# Patient Record
Sex: Female | Born: 2000 | Race: Black or African American | Hispanic: No | Marital: Single | Smoking: Never smoker
Health system: Southern US, Community
[De-identification: ages and names within clinical notes are randomized; demographics above are authoritative.]

## PROBLEM LIST (undated history)

## (undated) DIAGNOSIS — D563 Thalassemia minor: Secondary | ICD-10-CM

## (undated) DIAGNOSIS — L309 Dermatitis, unspecified: Secondary | ICD-10-CM

## (undated) DIAGNOSIS — E249 Cushing's syndrome, unspecified: Secondary | ICD-10-CM

## (undated) DIAGNOSIS — D649 Anemia, unspecified: Secondary | ICD-10-CM

## (undated) HISTORY — DX: Thalassemia minor: D56.3

## (undated) HISTORY — PX: STOMACH SURGERY: SHX791

---
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
## 2018-02-09 ENCOUNTER — Encounter (HOSPITAL_COMMUNITY): Payer: Self-pay | Admitting: Emergency Medicine

## 2018-02-09 ENCOUNTER — Other Ambulatory Visit: Payer: Self-pay

## 2018-02-09 ENCOUNTER — Ambulatory Visit (HOSPITAL_COMMUNITY)
Admission: EM | Admit: 2018-02-09 | Discharge: 2018-02-09 | Disposition: A | Discharge status: Home / Self Care | Payer: No Typology Code available for payment source | Attending: Family Medicine | Admitting: Family Medicine

## 2018-02-09 DIAGNOSIS — R2242 Localized swelling, mass and lump, left lower limb: Secondary | ICD-10-CM | POA: Diagnosis not present

## 2018-02-09 DIAGNOSIS — S80922A Unspecified superficial injury of left lower leg, initial encounter: Secondary | ICD-10-CM | POA: Insufficient documentation

## 2018-02-09 DIAGNOSIS — X58XXXA Exposure to other specified factors, initial encounter: Secondary | ICD-10-CM | POA: Diagnosis not present

## 2018-02-09 DIAGNOSIS — Z5189 Encounter for other specified aftercare: Secondary | ICD-10-CM

## 2018-02-09 MED ORDER — SULFAMETHOXAZOLE-TRIMETHOPRIM 800-160 MG PO TABS: 1.0000 | ORAL_TABLET | Freq: Two times a day (BID) | ORAL | 0 refills | Status: AC

## 2018-02-09 NOTE — Discharge Instructions (Signed)
Warm compresses Clean and apply dressing daily Take the antibiotic 2 x a day We did lab testing during this visit.  If there are any abnormal findings that require change in medicine or indicate a positive result, you will be notified.  If all of your tests are normal, you will not be called.

## 2018-02-09 NOTE — ED Triage Notes (Signed)
PT reports she has a scrape on her left leg that is not healing well. Has been there for a month.

## 2018-02-09 NOTE — ED Provider Notes (Addendum)
[facility]    CSN: [id number] Arrival date & time: 02/09/18  7371     History   Chief Complaint Chief Complaint  Patient presents with  . Wound Check    HPI Kimberly Hays is a 18 y.o. female.   HPI  Patient has a wound on her left shin that is been present for 3 to 4 weeks and not healing.  Swollen.  It is tender.  There is some yellow crusting.  She thinks it is infected.  She does not remember if she had trauma.  She does not think there is a foreign body.  She has not had any fever.  She states the area itches somewhat and is tender to touch.  Is in good health.  No recent travel.  Past Medical History:  Diagnosis Date  . Anemia     There are no active problems to display for this patient.   Past Surgical History:  Procedure Laterality Date  . STOMACH SURGERY     16 days old     OB History   No obstetric history on file.      Home Medications    Prior to Admission medications   Medication Sig Start Date End Date Taking? Authorizing Provider  loperamide (IMODIUM) 2 MG capsule Take 2 mg by mouth daily as needed for diarrhea or loose stools.    [provider]  sulfamethoxazole-trimethoprim (BACTRIM DS,SEPTRA DS) 800-160 MG tablet Take 1 tablet by mouth 2 (two) times daily for 7 days. 02/09/18 02/16/18  Kimberly Moore, MD    Family History No family history on file.  Social History Social History   Tobacco Use  . Smoking status: Never Smoker  . Smokeless tobacco: Never Used  Substance Use Topics  . Alcohol use: No    Frequency: Never  . Drug use: No     Allergies   Patient has no known allergies.   Review of Systems Review of Systems  Constitutional: Negative for chills and fever.  HENT: Negative for ear pain and sore throat.   Eyes: Negative for pain and visual disturbance.  Respiratory: Negative for cough and shortness of breath.   Cardiovascular: Negative for chest pain and palpitations.  Gastrointestinal: Negative  for abdominal pain and vomiting.  Genitourinary: Negative for dysuria and hematuria.  Musculoskeletal: Negative for arthralgias and back pain.  Skin: Positive for rash. Negative for color change.  Neurological: Negative for seizures and syncope.  All other systems reviewed and are negative.    Physical Exam Triage Vital Signs ED Triage Vitals  Enc Vitals Group     BP 02/09/18 0835 137/88     Pulse Rate 02/09/18 0835 99     Resp 02/09/18 0835 18     Temp 02/09/18 0835 97.9 F (36.6 C)     Temp Source 02/09/18 0835 Oral     SpO2 02/09/18 0835 99 %     Weight --      Height --      Head Circumference --      Peak Flow --      Pain Score 02/09/18 0836 0     Pain Loc --      Pain Edu? --      Excl. in GC? --    No data found.  Updated Vital Signs BP 137/88 (BP Location: Right Arm)   Pulse 99   Temp 97.9 F (36.6 C) (Oral)   Resp 18   LMP 01/27/2018   SpO2  99%    Physical Exam Constitutional:      General: She is not in acute distress.    Appearance: She is well-developed.  HENT:     Head: Normocephalic and atraumatic.  Eyes:     Conjunctiva/sclera: Conjunctivae normal.     Pupils: Pupils are equal, round, and reactive to light.  Neck:     Musculoskeletal: Normal range of motion.  Cardiovascular:     Rate and Rhythm: Normal rate.  Pulmonary:     Effort: Pulmonary effort is normal. No respiratory distress.  Abdominal:     General: There is no distension.     Palpations: Abdomen is soft.  Musculoskeletal: Normal range of motion.  Skin:    General: Skin is warm and dry.     Comments: Left leg has a 2 x 3 cm nodule with a superficial eschar on the top.  This is soaked off.  There is slight purulence underneath it.  No fluctuance to indicate abscess.  Cultures obtained  Neurological:     Mental Status: She is alert.      UC Treatments / Results  Labs (all labs ordered are listed, but only abnormal results are displayed) Labs Reviewed  AEROBIC CULTURE  (SUPERFICIAL SPECIMEN)    EKG None  Radiology No results found.  Procedures Procedures (including critical care time)  Medications Ordered in UC Medications - No data to display  Initial Impression / Assessment and Plan / UC Course  I have reviewed the triage vital signs and the nursing notes.  Pertinent labs & imaging results that were available during my care of the patient were reviewed by me and considered in my medical decision making (see chart for details).     Unusual not to have healing after 3 to 4 weeks.  We will do a culture, start antibiotics, and discussed more wound care. Final Clinical Impressions(s) / UC Diagnoses   Final diagnoses:  Visit for wound check     Discharge Instructions     Warm compresses Clean and apply dressing daily Take the antibiotic 2 x a day We did lab testing during this visit.  If there are any abnormal findings that require change in medicine or indicate a positive result, you will be notified.  If all of your tests are normal, you will not be called.      ED Prescriptions    Medication Sig Dispense Auth. Provider   sulfamethoxazole-trimethoprim (BACTRIM DS,SEPTRA DS) 800-160 MG tablet Take 1 tablet by mouth 2 (two) times daily for 7 days. 14 tablet Kimberly Moore, MD     Controlled Substance Prescriptions [place] Controlled Substance Registry consulted? Not Applicable   Kimberly Moore, MD 02/09/18 2119    Kimberly Moore, MD 02/09/18 2119

## 2018-02-11 LAB — AEROBIC CULTURE  (SUPERFICIAL SPECIMEN)

## 2018-02-11 LAB — AEROBIC CULTURE W GRAM STAIN (SUPERFICIAL SPECIMEN)
Culture: NORMAL
Gram Stain: NONE SEEN

## 2018-03-18 ENCOUNTER — Encounter (HOSPITAL_COMMUNITY): Payer: Self-pay | Admitting: *Deleted

## 2018-03-18 ENCOUNTER — Other Ambulatory Visit: Payer: Self-pay

## 2018-03-18 ENCOUNTER — Ambulatory Visit (HOSPITAL_COMMUNITY)
Admission: EM | Admit: 2018-03-18 | Discharge: 2018-03-18 | Disposition: A | Discharge status: Home / Self Care | Payer: BLUE CROSS/BLUE SHIELD | Attending: Internal Medicine | Admitting: Internal Medicine

## 2018-03-18 DIAGNOSIS — R05 Cough: Secondary | ICD-10-CM | POA: Diagnosis not present

## 2018-03-18 DIAGNOSIS — B9789 Other viral agents as the cause of diseases classified elsewhere: Secondary | ICD-10-CM | POA: Diagnosis not present

## 2018-03-18 DIAGNOSIS — J069 Acute upper respiratory infection, unspecified: Secondary | ICD-10-CM | POA: Diagnosis not present

## 2018-03-18 DIAGNOSIS — Z20828 Contact with and (suspected) exposure to other viral communicable diseases: Secondary | ICD-10-CM

## 2018-03-18 MED ORDER — BENZONATATE 100 MG PO CAPS: 200.0000 mg | ORAL_CAPSULE | Freq: Three times a day (TID) | ORAL | 0 refills | Status: DC | PRN

## 2018-03-18 NOTE — ED Triage Notes (Signed)
C/O productive cough, congestion, runny nose x approx 1 wk.  C/O intermittent HA.  Unsure if fevers.

## 2018-03-18 NOTE — ED Provider Notes (Signed)
[facility]    CSN: [id number] Arrival date & time: 03/18/18  1131     History   Chief Complaint Chief Complaint  Patient presents with  . Appointment    1200  . Cough    HPI Kimberly Hays is a 18 y.o. female.   18 year old female with no prior medical problems presents to urgent care complaining of cough this productive of thick white phlegm.  She may have had some fevers earlier this week but denies fever lately.  The cough hurts her lower back but she denies shortness of breath.  Reports that her brother has also been sick and was seen here at urgent care earlier this week for viral URI.     Past Medical History:  Diagnosis Date  . Anemia     There are no active problems to display for this patient.   Past Surgical History:  Procedure Laterality Date  . STOMACH SURGERY     67 days old     OB History   No obstetric history on file.      Home Medications    Prior to Admission medications   Medication Sig Start Date End Date Taking? Authorizing Provider  benzonatate (TESSALON) 100 MG capsule Take 2 capsules (200 mg total) by mouth 3 (three) times daily as needed for cough. 03/18/18   Arnaldo Natal, MD  loperamide (IMODIUM) 2 MG capsule Take 2 mg by mouth daily as needed for diarrhea or loose stools.    [provider]    Family History Family History  Problem Relation Age of Onset  . Diabetes Father     Social History Social History   Tobacco Use  . Smoking status: Never Smoker  . Smokeless tobacco: Never Used  Substance Use Topics  . Alcohol use: No    Frequency: Never  . Drug use: No     Allergies   Patient has no known allergies.   Review of Systems Review of Systems  Constitutional: Negative for chills and fever.  HENT: Negative for sore throat and tinnitus.   Eyes: Negative for redness.  Respiratory: Positive for cough. Negative for shortness of breath.   Cardiovascular: Negative for chest pain and  palpitations.  Gastrointestinal: Negative for abdominal pain, diarrhea, nausea and vomiting.  Genitourinary: Negative for dysuria, frequency and urgency.  Musculoskeletal: Negative for myalgias.  Skin: Negative for rash.       No lesions  Neurological: Negative for weakness.  Hematological: Does not bruise/bleed easily.  Psychiatric/Behavioral: Negative for suicidal ideas.     Physical Exam Triage Vital Signs ED Triage Vitals  Enc Vitals Group     BP 03/18/18 1158 (!) 127/98     Pulse Rate 03/18/18 1158 (!) 103     Resp 03/18/18 1158 16     Temp 03/18/18 1157 98.5 F (36.9 C)     Temp Source 03/18/18 1157 Oral     SpO2 03/18/18 1158 100 %     Weight --      Height --      Head Circumference --      Peak Flow --      Pain Score 03/18/18 1159 0     Pain Loc --      Pain Edu? --      Excl. in GC? --    No data found.  Updated Vital Signs BP (!) 127/98   Pulse (!) 103   Temp 98.5 F (36.9 C) (Oral)   Resp 16  LMP 03/04/2018 (Exact Date)   SpO2 100%   Visual Acuity Right Eye Distance:   Left Eye Distance:   Bilateral Distance:    Right Eye Near:   Left Eye Near:    Bilateral Near:     Physical Exam Vitals signs and nursing note reviewed.  Constitutional:      General: She is not in acute distress.    Appearance: She is well-developed.  HENT:     Head: Normocephalic and atraumatic.  Eyes:     General: No scleral icterus.    Conjunctiva/sclera: Conjunctivae normal.     Pupils: Pupils are equal, round, and reactive to light.  Neck:     Musculoskeletal: Normal range of motion and neck supple.     Thyroid: No thyromegaly.     Vascular: No JVD.     Trachea: No tracheal deviation.  Cardiovascular:     Rate and Rhythm: Normal rate and regular rhythm.     Heart sounds: Normal heart sounds. No murmur. No friction rub. No gallop.   Pulmonary:     Effort: Pulmonary effort is normal.     Breath sounds: Normal breath sounds.  Abdominal:     General: Bowel  sounds are normal. There is no distension.     Palpations: Abdomen is soft.     Tenderness: There is no abdominal tenderness.  Musculoskeletal: Normal range of motion.  Lymphadenopathy:     Cervical: No cervical adenopathy.  Skin:    General: Skin is warm and dry.  Neurological:     Mental Status: She is alert and oriented to person, place, and time.     Cranial Nerves: No cranial nerve deficit.  Psychiatric:        Behavior: Behavior normal.        Thought Content: Thought content normal.        Judgment: Judgment normal.      UC Treatments / Results  Labs (all labs ordered are listed, but only abnormal results are displayed) Labs Reviewed - No data to display  EKG None  Radiology No results found.  Procedures Procedures (including critical care time)  Medications Ordered in UC Medications - No data to display  Initial Impression / Assessment and Plan / UC Course  I have reviewed the triage vital signs and the nursing notes.  Pertinent labs & imaging results that were available during my care of the patient were reviewed by me and considered in my medical decision making (see chart for details).     Bronchitic cough.  Symptomatic relief  Final Clinical Impressions(s) / UC Diagnoses   Final diagnoses:  Viral URI with cough   Discharge Instructions   None    ED Prescriptions    Medication Sig Dispense Auth. Provider   benzonatate (TESSALON) 100 MG capsule Take 2 capsules (200 mg total) by mouth 3 (three) times daily as needed for cough. 21 capsule Arnaldo Natal, MD     Controlled Substance Prescriptions [place] Controlled Substance Registry consulted? Not Applicable   Arnaldo Natal, MD 03/18/18 1217

## 2018-03-27 ENCOUNTER — Emergency Department (HOSPITAL_COMMUNITY): Payer: BLUE CROSS/BLUE SHIELD

## 2018-03-27 ENCOUNTER — Emergency Department (HOSPITAL_COMMUNITY)
Admission: EM | Admit: 2018-03-27 | Discharge: 2018-03-27 | Disposition: A | Discharge status: Home / Self Care | Payer: BLUE CROSS/BLUE SHIELD | Attending: Emergency Medicine | Admitting: Emergency Medicine

## 2018-03-27 ENCOUNTER — Encounter (HOSPITAL_COMMUNITY): Payer: Self-pay | Admitting: *Deleted

## 2018-03-27 DIAGNOSIS — R0981 Nasal congestion: Secondary | ICD-10-CM | POA: Diagnosis not present

## 2018-03-27 DIAGNOSIS — J988 Other specified respiratory disorders: Secondary | ICD-10-CM | POA: Diagnosis not present

## 2018-03-27 DIAGNOSIS — R05 Cough: Secondary | ICD-10-CM | POA: Insufficient documentation

## 2018-03-27 DIAGNOSIS — M7918 Myalgia, other site: Secondary | ICD-10-CM | POA: Diagnosis present

## 2018-03-27 MED ORDER — DOXYCYCLINE HYCLATE 100 MG PO CAPS: 100.0000 mg | ORAL_CAPSULE | Freq: Two times a day (BID) | ORAL | 0 refills | Status: DC

## 2018-03-27 NOTE — ED Provider Notes (Addendum)
[facility] EMERGENCY DEPARTMENT Provider Note   CSN: [id number] Arrival date & time: 03/27/18  0802     History   Chief Complaint Chief Complaint  Patient presents with  . Generalized Body Aches    HPI Kimberly Hays is a 18 y.o. female.  HPI    18 year old female presents today with complaints of body aches.  Patient notes that for approximately 2 weeks she has had body aches rhinorrhea nasal congestion.  She notes she was seen approximately 10 days ago and diagnosed with viral upper respiratory infection.  She took cough medication with did not help.  She continues to have cough with yellow sputum.  She denies any chest pain or shortness of breath.  She denies any objective fevers.  She notes taking ibuprofen, no medications today.  She did not receive the influenza vaccine, she denies any chronic health conditions, no recent travel.  She notes her brother was sick with similar symptoms.     Past Medical History:  Diagnosis Date  . Anemia     There are no active problems to display for this patient.   Past Surgical History:  Procedure Laterality Date  . STOMACH SURGERY     27 days old      OB History   No obstetric history on file.     Home Medications    Prior to Admission medications   Medication Sig Start Date End Date Taking? Authorizing Provider  ibuprofen (ADVIL,MOTRIN) 200 MG tablet Take 400 mg by mouth every 6 (six) hours as needed for fever.   Yes [provider]  loperamide (IMODIUM) 2 MG capsule Take 2 mg by mouth daily as needed for diarrhea or loose stools.   Yes [provider]  benzonatate (TESSALON) 100 MG capsule Take 2 capsules (200 mg total) by mouth 3 (three) times daily as needed for cough. Patient not taking: Reported on 03/27/2018 03/18/18   Arnaldo Natal, MD  doxycycline (VIBRAMYCIN) 100 MG capsule Take 1 capsule (100 mg total) by mouth 2 (two) times daily. 03/27/18   Eyvonne Mechanic, PA-C     Family History Family History  Problem Relation Age of Onset  . Diabetes Father     Social History Social History   Tobacco Use  . Smoking status: Never Smoker  . Smokeless tobacco: Never Used  Substance Use Topics  . Alcohol use: No    Frequency: Never  . Drug use: No     Allergies   Patient has no known allergies.   Review of Systems Review of Systems  All other systems reviewed and are negative.   Physical Exam Updated Vital Signs BP 119/84 (BP Location: Right Arm)   Pulse (!) 111   Temp 98.3 F (36.8 C) (Oral)   Resp 18   LMP 03/04/2018 (Exact Date)   SpO2 100%   Physical Exam Vitals signs and nursing note reviewed.  Constitutional:      Appearance: She is well-developed.  HENT:     Head: Normocephalic and atraumatic.     Comments: Bilateral nasal mucosal edema with rhinorrhea, oropharynx clear with no erythema tonsillar swelling or exudate, neck supple with no lymphadenopathy Eyes:     General: No scleral icterus.       Right eye: No discharge.        Left eye: No discharge.     Conjunctiva/sclera: Conjunctivae normal.     Pupils: Pupils are equal, round, and reactive to light.  Neck:  Musculoskeletal: Normal range of motion.     Vascular: No JVD.     Trachea: No tracheal deviation.  Cardiovascular:     Rate and Rhythm: Regular rhythm.     Heart sounds: No murmur. No friction rub. No gallop.   Pulmonary:     Effort: Pulmonary effort is normal. No respiratory distress.     Breath sounds: No stridor. No wheezing, rhonchi or rales.  Neurological:     Mental Status: She is alert and oriented to person, place, and time.     Coordination: Coordination normal.  Psychiatric:        Behavior: Behavior normal.        Thought Content: Thought content normal.        Judgment: Judgment normal.      ED Treatments / Results  Labs (all labs ordered are listed, but only abnormal results are displayed) Labs Reviewed - No data to  display  EKG None  Radiology Dg Chest 2 View  Result Date: 03/27/2018 CLINICAL DATA:  Cough and congestion with body aches for 2 weeks EXAM: CHEST - 2 VIEW COMPARISON:  None FINDINGS: Normal heart size, mediastinal contours, and pulmonary vascularity. Lungs clear. No pleural effusion or pneumothorax. Triconvex thoracolumbar scoliosis. Suspected pectus deformity. IMPRESSION: No acute abnormalities. Electronically Signed   By: Ulyses Southward M.D.   On: 03/27/2018 10:36    Procedures Procedures (including critical care time)  Medications Ordered in ED Medications - No data to display   Initial Impression / Assessment and Plan / ED Course  I have reviewed the triage vital signs and the nursing notes.  Pertinent labs & imaging results that were available during my care of the patient were reviewed by me and considered in my medical decision making (see chart for details).     Labs:   Imaging:  Consults:  Therapeutics:  Discharge Meds: Doxycycline  Assessment/Plan: 18 year old female presents today with respiratory infection.  She has had several weeks of symptoms, I find it reasonable to start patient on the antibiotic given persistence of symptoms.  Patient will be encouraged to follow-up as an outpatient return immediately if she develops any new or worsening signs or symptoms.  She verbalized understanding and agreement to today's plan had no further questions or concerns.    Final Clinical Impressions(s) / ED Diagnoses   Final diagnoses:  Respiratory infection    ED Discharge Orders         Ordered    doxycycline (VIBRAMYCIN) 100 MG capsule  2 times daily     03/27/18 1041           Eyvonne Mechanic, PA-C 03/27/18 1043    Eyvonne Mechanic, PA-C 03/27/18 1044    Pricilla Loveless, MD 03/31/18 1520

## 2018-03-27 NOTE — Discharge Instructions (Addendum)
Please read attached information. If you experience any new or worsening signs or symptoms please return to the emergency room for evaluation. Please follow-up with your primary care provider or specialist as discussed. Please use medication prescribed only as directed and discontinue taking if you have any concerning signs or symptoms.   °

## 2018-03-27 NOTE — ED Triage Notes (Signed)
Pt in c/o cough and congestion with body aches for the last two weeks, was seen previously for same with no improvement

## 2019-09-07 ENCOUNTER — Other Ambulatory Visit: Payer: Self-pay | Admitting: Internal Medicine

## 2019-09-07 DIAGNOSIS — E249 Cushing's syndrome, unspecified: Secondary | ICD-10-CM

## 2019-09-07 DIAGNOSIS — L989 Disorder of the skin and subcutaneous tissue, unspecified: Secondary | ICD-10-CM

## 2019-09-20 ENCOUNTER — Other Ambulatory Visit: Payer: Medicaid Other

## 2019-09-30 ENCOUNTER — Ambulatory Visit
Admission: RE | Admit: 2019-09-30 | Discharge: 2019-09-30 | Disposition: A | Discharge status: Home / Self Care | Payer: 59 | Source: Ambulatory Visit | Attending: Physician Assistant | Admitting: Physician Assistant

## 2019-09-30 ENCOUNTER — Other Ambulatory Visit: Payer: Self-pay

## 2019-09-30 VITALS — BP 128/83 | HR 103 | Temp 98.3°F | Resp 16

## 2019-09-30 DIAGNOSIS — L309 Dermatitis, unspecified: Secondary | ICD-10-CM

## 2019-09-30 MED ORDER — TRIAMCINOLONE ACETONIDE 0.025 % EX OINT: 1.0000 "application " | TOPICAL_OINTMENT | Freq: Two times a day (BID) | CUTANEOUS | 0 refills | Status: DC

## 2019-09-30 NOTE — Discharge Instructions (Signed)
Trial triamcinolone ointment to affected area on hand. Wrap with plastic wrap to allow medicine to soak the fingers. Follow up with dermatology for further evaluation and management needed.

## 2019-09-30 NOTE — ED Triage Notes (Addendum)
Pt presents for evaluation of right ring finger irritation. She reports she noticed it one week ago.

## 2019-09-30 NOTE — ED Provider Notes (Signed)
[name]    CSN: [id number] Arrival date & time: 09/30/19  1302      History   Chief Complaint Chief Complaint  Patient presents with  . Finger Injury    HPI Kimberly Hays is a 19 y.o. female.   19 year old female comes in for possible infection to the right ring finger.  Patient with history of eczema, and has had recent flareup for the past week.  Now noticing some redness, irritation along nailbed of right ring finger.  Denies fever.  States was prescribed Eucrisa by dermatology, but has not picked this up yet.  Has used steroid creams in the past without significant relief.     Past Medical History:  Diagnosis Date  . Anemia     There are no problems to display for this patient.   Past Surgical History:  Procedure Laterality Date  . STOMACH SURGERY     86 days old     OB History   No obstetric history on file.      Home Medications    Prior to Admission medications   Medication Sig Start Date End Date Taking? Authorizing Provider  ibuprofen (ADVIL,MOTRIN) 200 MG tablet Take 400 mg by mouth every 6 (six) hours as needed for fever.    [provider]  loperamide (IMODIUM) 2 MG capsule Take 2 mg by mouth daily as needed for diarrhea or loose stools.    [provider]  triamcinolone (KENALOG) 0.025 % ointment Apply 1 application topically 2 (two) times daily. 09/30/19   Belinda Fisher, PA-C    Family History Family History  Problem Relation Age of Onset  . Diabetes Father     Social History Social History   Tobacco Use  . Smoking status: Never Smoker  . Smokeless tobacco: Never Used  Substance Use Topics  . Alcohol use: No  . Drug use: No     Allergies   Patient has no known allergies.   Review of Systems Review of Systems  Reason unable to perform ROS: See HPI as above.     Physical Exam Triage Vital Signs ED Triage Vitals  Enc Vitals Group     BP 09/30/19 1351 128/83     Pulse Rate 09/30/19 1351 (!)  103     Resp 09/30/19 1351 16     Temp 09/30/19 1351 98.3 F (36.8 C)     Temp Source 09/30/19 1351 Oral     SpO2 09/30/19 1351 99 %     Weight --      Height --      Head Circumference --      Peak Flow --      Pain Score 09/30/19 1402 6     Pain Loc --      Pain Edu? --      Excl. in GC? --    No data found.  Updated Vital Signs BP 128/83 (BP Location: Left Arm)   Pulse (!) 103   Temp 98.3 F (36.8 C) (Oral)   Resp 16   LMP 08/26/2019   SpO2 99%   Physical Exam Constitutional:      General: She is not in acute distress.    Appearance: Normal appearance. She is well-developed. She is not toxic-appearing or diaphoretic.  HENT:     Head: Normocephalic and atraumatic.  Eyes:     Conjunctiva/sclera: Conjunctivae normal.     Pupils: Pupils are equal, round, and reactive to light.  Pulmonary:  Effort: Pulmonary effort is normal. No respiratory distress.  Musculoskeletal:     Cervical back: Normal range of motion and neck supple.  Skin:    General: Skin is warm and dry.     Comments: Eczematous patches to the hands with hyperpigmentation and skin cracking/peeling. Right ring finger with wound around nailbed. No erythema, warmth, active drainage. No paronychia  Neurological:     Mental Status: She is alert and oriented to person, place, and time.      UC Treatments / Results  Labs (all labs ordered are listed, but only abnormal results are displayed) Labs Reviewed - No data to display  EKG   Radiology No results found.  Procedures Procedures (including critical care time)  Medications Ordered in UC Medications - No data to display  Initial Impression / Assessment and Plan / UC Course  I have reviewed the triage vital signs and the nursing notes.  Pertinent labs & imaging results that were available during my care of the patient were reviewed by me and considered in my medical decision making (see chart for details).    No signs of infection.  We will  trial a short course of triamcinolone ointment.  Otherwise patient to contact dermatology for further evaluation and management needed.  Return precautions given.  Final Clinical Impressions(s) / UC Diagnoses   Final diagnoses:  Eczema, unspecified type    ED Prescriptions    Medication Sig Dispense Auth. Provider   triamcinolone (KENALOG) 0.025 % ointment Apply 1 application topically 2 (two) times daily. 30 g Belinda Fisher, PA-C     PDMP not reviewed this encounter.   Belinda Fisher, PA-C 09/30/19 1655

## 2019-10-12 ENCOUNTER — Other Ambulatory Visit: Payer: Self-pay

## 2019-10-12 ENCOUNTER — Ambulatory Visit
Admission: RE | Admit: 2019-10-12 | Discharge: 2019-10-12 | Disposition: A | Discharge status: Home / Self Care | Payer: 59 | Source: Ambulatory Visit | Attending: Internal Medicine | Admitting: Internal Medicine

## 2019-10-12 DIAGNOSIS — E249 Cushing's syndrome, unspecified: Secondary | ICD-10-CM

## 2019-10-12 DIAGNOSIS — L989 Disorder of the skin and subcutaneous tissue, unspecified: Secondary | ICD-10-CM

## 2019-10-12 MED ORDER — GADOBENATE DIMEGLUMINE 529 MG/ML IV SOLN
15.0000 mL | Freq: Once | INTRAVENOUS | Status: AC | PRN
  Administered 2019-10-12: 15 mL via INTRAVENOUS

## 2020-05-27 ENCOUNTER — Ambulatory Visit (INDEPENDENT_AMBULATORY_CARE_PROVIDER_SITE_OTHER): Payer: 59 | Admitting: Podiatry

## 2020-05-27 ENCOUNTER — Other Ambulatory Visit: Payer: Self-pay

## 2020-05-27 DIAGNOSIS — L600 Ingrowing nail: Secondary | ICD-10-CM

## 2020-05-27 NOTE — Patient Instructions (Signed)

## 2020-05-28 ENCOUNTER — Telehealth: Payer: Self-pay | Admitting: *Deleted

## 2020-05-28 MED ORDER — NEOMYCIN-POLYMYXIN-HC 3.5-10000-1 OT SUSP: OTIC | 0 refills | Status: DC

## 2020-05-28 NOTE — Telephone Encounter (Signed)
Patient is calling for status of a medication that was supposed to be sent to pharmacy ([facility]). She went there yesterday and this morning and was not there. Please advise.

## 2020-05-28 NOTE — Telephone Encounter (Signed)
I've sent now. Thanks Ammie

## 2020-05-29 ENCOUNTER — Encounter: Payer: Self-pay | Admitting: Podiatry

## 2020-05-29 NOTE — Progress Notes (Signed)
  Subjective:  Patient ID: Kimberly Hays, female    DOB: February 13, 2000,  MRN: [id number]  Chief Complaint  Patient presents with  . Ingrown Toenail    Right hallux ingrown     20 y.o. female presents with the above complaint. History confirmed with patient.  Has pain without drainage  Objective:  Physical Exam: warm, good capillary refill, no trophic changes or ulcerative lesions, normal DP and PT pulses and normal sensory exam.  Left foot: Left hallux medial border ingrown without paronychia Assessment:   1. Ingrowing left great toenail      Plan:  Patient was evaluated and treated and all questions answered.    Ingrown Nail, left -Patient elects to proceed with minor surgery to remove ingrown toenail today. Consent reviewed and signed by patient. -Ingrown nail excised. See procedure note. -Educated on post-procedure care including soaking. Written instructions provided and reviewed. -Patient to follow up in 2 weeks for nail check.  Procedure: Excision of Ingrown Toenail Location: Left 1st toe medial nail borders. Anesthesia: Lidocaine 1% plain; 1.5 mL and Marcaine 0.5% plain; 1.5 mL, digital block. Skin Prep: Betadine. Dressing: Silvadene; telfa; dry, sterile, compression dressing. Technique: Following skin prep, the toe was exsanguinated and a tourniquet was secured at the base of the toe. The affected nail border was freed, split with a nail splitter, and excised. Chemical matrixectomy was then performed with phenol and irrigated out with alcohol. The tourniquet was then removed and sterile dressing applied. Disposition: Patient tolerated procedure well. Patient to return in 2 weeks for follow-up.     Return in about 2 weeks (around 06/10/2020) for nail re-check.

## 2020-05-29 NOTE — Telephone Encounter (Signed)
Called patient to informed thru Vmessage that prescription has been sent to pharmacy.

## 2020-06-10 ENCOUNTER — Ambulatory Visit (INDEPENDENT_AMBULATORY_CARE_PROVIDER_SITE_OTHER): Payer: 59 | Admitting: Podiatry

## 2020-06-10 ENCOUNTER — Encounter: Payer: Self-pay | Admitting: Podiatry

## 2020-06-10 ENCOUNTER — Other Ambulatory Visit: Payer: Self-pay

## 2020-06-10 DIAGNOSIS — L600 Ingrowing nail: Secondary | ICD-10-CM

## 2020-06-10 NOTE — Progress Notes (Signed)
  Subjective:  Patient ID: Kimberly Hays, female    DOB: 03/13/2000,  MRN: [id number]  Chief Complaint  Patient presents with  . Ingrown Toenail      2 week follow up nail check left    20 y.o. female returns with the above complaint. History confirmed with patient.  Doing much better Objective:  Physical Exam: warm, good capillary refill, no trophic changes or ulcerative lesions, normal DP and PT pulses and normal sensory exam.  Left foot: Matricectomy site is healing well Assessment:   1. Ingrowing left great toenail      Plan:  Patient was evaluated and treated and all questions answered.    Ingrown Nail, left -She is doing much better today.  I recommend she leave this open to air and discontinue soaks at this point.  Return as needed   Return if symptoms worsen or fail to improve.

## 2020-08-02 ENCOUNTER — Encounter (HOSPITAL_COMMUNITY): Payer: Self-pay | Admitting: Medical Oncology

## 2020-08-02 ENCOUNTER — Other Ambulatory Visit: Payer: Self-pay

## 2020-08-02 ENCOUNTER — Ambulatory Visit (HOSPITAL_COMMUNITY)
Admission: EM | Admit: 2020-08-02 | Discharge: 2020-08-02 | Disposition: A | Discharge status: Home / Self Care | Payer: 59 | Attending: Medical Oncology | Admitting: Medical Oncology

## 2020-08-02 DIAGNOSIS — R002 Palpitations: Secondary | ICD-10-CM | POA: Insufficient documentation

## 2020-08-02 HISTORY — DX: Cushing's syndrome, unspecified: E24.9

## 2020-08-02 LAB — BASIC METABOLIC PANEL
Anion gap: 8 (ref 5–15)
BUN: 13 mg/dL (ref 6–20)
CO2: 21 mmol/L — ABNORMAL LOW (ref 22–32)
Calcium: 9 mg/dL (ref 8.9–10.3)
Chloride: 108 mmol/L (ref 98–111)
Creatinine, Ser: 0.5 mg/dL (ref 0.44–1.00)
GFR, Estimated: >60 mL/min (ref >60)
Glucose, Bld: 83 mg/dL (ref 70–99)
Potassium: 4.1 mmol/L (ref 3.5–5.1)
Sodium: 137 mmol/L (ref 135–145)

## 2020-08-02 LAB — CBC
HCT: 31.7 % — ABNORMAL LOW (ref 36.0–46.0)
Hemoglobin: 9.9 g/dL — ABNORMAL LOW (ref 12.0–15.0)
MCH: 19.7 pg — ABNORMAL LOW (ref 26.0–34.0)
MCHC: 31.2 g/dL (ref 30.0–36.0)
MCV: 63.1 fL — ABNORMAL LOW (ref 80.0–100.0)
Platelets: 225 10*3/uL (ref 150–400)
RBC: 5.02 MIL/uL (ref 3.87–5.11)
RDW: 14.9 % (ref 11.5–15.5)
WBC: 3.3 10*3/uL — ABNORMAL LOW (ref 4.0–10.5)
nRBC: 0 % (ref 0.0–0.2)

## 2020-08-02 LAB — TSH: TSH: 1.431 u[IU]/mL (ref 0.350–4.500)

## 2020-08-02 NOTE — ED Provider Notes (Signed)
[facility]    CSN: [id number] Arrival date & time: 08/02/20  1002      History   Chief Complaint Chief Complaint  Patient presents with  . Tachycardia    HPI Kimberly Hays is a 20 y.o. female.   HPI  Tachycardia: Patient reports that for years she has had episodes of tachycardia with physical activity.  She states that yesterday she was out in the heat and helping her boss move some furniture when she felt her normal palpitations.  She states that normally the palpitations resolved with rest however these lasted a bit longer than normal.  The episodes do not come with syncope, dizziness, chest pain or shortness of breath.  She states she has a history of anemia and Cushing syndrome.  She states that she takes iron when she remembers.  Caffeine intake is low.  She reports that she has never seen a cardiologist. Past Medical History:  Diagnosis Date  . Anemia   . Cushing syndrome (HCC)    per pt report had cushing syndrome a year ago, reports now it has cleared up    There are no problems to display for this patient.   Past Surgical History:  Procedure Laterality Date  . STOMACH SURGERY     72 days old     OB History   No obstetric history on file.      Home Medications    Prior to Admission medications   Medication Sig Start Date End Date Taking? Authorizing Provider  Dupilumab (DUPIXENT [place]) Inject into the skin.   Yes [provider]  ibuprofen (ADVIL,MOTRIN) 200 MG tablet Take 400 mg by mouth every 6 (six) hours as needed for fever.   Yes [provider]  loperamide (IMODIUM) 2 MG capsule Take 2 mg by mouth daily as needed for diarrhea or loose stools.   Yes [provider]  UNABLE TO FIND Med Name: eucrisa cream for eczema per pt   Yes [provider]    Family History Family History  Problem Relation Age of Onset  . Anemia Mother   . Diabetes Father     Social History Social History   Tobacco Use  .  Smoking status: Never  . Smokeless tobacco: Never  Substance Use Topics  . Alcohol use: No  . Drug use: No     Allergies   Patient has no known allergies.   Review of Systems Review of Systems  As stated above in HPI Physical Exam Triage Vital Signs ED Triage Vitals  Enc Vitals Group     BP 08/02/20 1027 111/76     Pulse Rate 08/02/20 1027 91     Resp 08/02/20 1027 18     Temp 08/02/20 1027 98.1 F (36.7 C)     Temp Source 08/02/20 1027 Oral     SpO2 08/02/20 1027 98 %     Weight --      Height --      Head Circumference --      Peak Flow --      Pain Score 08/02/20 1014 0     Pain Loc --      Pain Edu? --      Excl. in GC? --    No data found.  Updated Vital Signs BP 111/76   Pulse 91   Temp 98.1 F (36.7 C) (Oral)   Resp 18   LMP 07/22/2020   SpO2 98%   Physical Exam Vitals and nursing  note reviewed.  Constitutional:      General: She is not in acute distress.    Appearance: Normal appearance. She is not ill-appearing, toxic-appearing or diaphoretic.  HENT:     Head: Normocephalic and atraumatic.     Mouth/Throat:     Mouth: Mucous membranes are moist.  Eyes:     Extraocular Movements: Extraocular movements intact.     Pupils: Pupils are equal, round, and reactive to light.     Comments: No significant pallor  Neck:     Comments: No carotid bruits auscultated bilaterally  Cardiovascular:     Rate and Rhythm: Normal rate and regular rhythm.     Heart sounds: Normal heart sounds.  Pulmonary:     Effort: Pulmonary effort is normal.     Breath sounds: Normal breath sounds.  Musculoskeletal:     Cervical back: Normal range of motion and neck supple.  Skin:    General: Skin is warm.     Coloration: Skin is not pale.  Neurological:     Mental Status: She is alert and oriented to person, place, and time.  Psychiatric:        Mood and Affect: Mood normal.        Behavior: Behavior normal.        Thought Content: Thought content normal.         Judgment: Judgment normal.     UC Treatments / Results  Labs (all labs ordered are listed, but only abnormal results are displayed) Labs Reviewed  CBC  BASIC METABOLIC PANEL  TSH    EKG   Radiology No results found.  Procedures Procedures (including critical care time)  Medications Ordered in UC Medications - No data to display  Initial Impression / Assessment and Plan / UC Course  I have reviewed the triage vital signs and the nursing notes.  Pertinent labs & imaging results that were available during my care of the patient were reviewed by me and considered in my medical decision making (see chart for details).     New.  Checking labs given her history of anemia and Cushing's disease.  I have encouraged patient to stay hydrated with water, to avoid heat or physical activity when possible.  Discussed red flag signs and symptoms and discussed the importance of cardiology follow-up.  We did review her EKG as well.  Final Clinical Impressions(s) / UC Diagnoses   Final diagnoses:  Palpitations   Discharge Instructions   None    ED Prescriptions   None    PDMP not reviewed this encounter.   Rushie Chestnut, [place] 08/02/20 1044

## 2020-08-02 NOTE — ED Triage Notes (Signed)
Pt c/o heart palpitations for a couple years worsening when working hard, states feels heart beating fast, sensation it is beating into throat. Reports palpitations normally subside with rest, but reports yesterday she noted they continued as she rested.  Reports history of anemia.

## 2020-08-18 ENCOUNTER — Encounter: Payer: Self-pay | Admitting: Cardiology

## 2020-08-18 ENCOUNTER — Other Ambulatory Visit: Payer: Self-pay

## 2020-08-18 ENCOUNTER — Ambulatory Visit (INDEPENDENT_AMBULATORY_CARE_PROVIDER_SITE_OTHER): Payer: 59 | Admitting: Cardiology

## 2020-08-18 VITALS — BP 120/60 | HR 116 | Ht 67.0 in | Wt 139.0 lb

## 2020-08-18 DIAGNOSIS — D509 Iron deficiency anemia, unspecified: Secondary | ICD-10-CM

## 2020-08-18 DIAGNOSIS — R002 Palpitations: Secondary | ICD-10-CM | POA: Diagnosis not present

## 2020-08-18 NOTE — Progress Notes (Signed)
Referring-[facility] Clent Jacks PA) Reason for referral-palpitations  HPI: 20 year old female for evaluation of palpitations at request of [facility].  Seen with complaints of tachycardia June 2022.  Laboratories show TSH 1.431, potassium 4.1 and hemoglobin 9.9 with MCV 63.1.  Patient states that recently she had noticed an elevation in her heart rate particularly with activities at work.  She would feel this in her throat and described as her heart racing.  She otherwise denies dyspnea on exertion, orthopnea, PND, pedal edema, exertional chest pain or syncope.  Since she was noted to be anemic which is longstanding and resumed her iron her symptoms have improved though she continues to have occasional feelings particularly in her throat.  Cardiology now asked to evaluate.  Current Outpatient Medications  Medication Sig Dispense Refill   Crisaborole 2 % OINT Apply topically 2 (two) times daily.     Dupilumab (DUPIXENT ) Inject into the skin.     ibuprofen (ADVIL,MOTRIN) 200 MG tablet Take 400 mg by mouth every 6 (six) hours as needed for fever. As Needed     loperamide (IMODIUM) 2 MG capsule Take 2 mg by mouth daily as needed for diarrhea or loose stools. As Needed     No current facility-administered medications for this visit.    No Known Allergies   Past Medical History:  Diagnosis Date   Anemia    Cushing syndrome (HCC)    per pt report had cushing syndrome a year ago, reports now it has cleared up    Past Surgical History:  Procedure Laterality Date   STOMACH SURGERY     74 days old     Social History   Socioeconomic History   Marital status: Single    Spouse name: Not on file   Number of children: Not on file   Years of education: Not on file   Highest education level: Not on file  Occupational History   Not on file  Tobacco Use   Smoking status: Never   Smokeless tobacco: Never  Substance and Sexual  Activity   Alcohol use: No   Drug use: No   Sexual activity: Not on file  Other Topics Concern   Not on file  Social History Narrative   Not on file   Social Determinants of Health   Financial Resource Strain: Not on file  Food Insecurity: Not on file  Transportation Needs: Not on file  Physical Activity: Not on file  Stress: Not on file  Social Connections: Not on file  Intimate Partner Violence: Not on file    Family History  Problem Relation Age of Onset   Anemia Mother    Diabetes Father     ROS: no fevers or chills, productive cough, hemoptysis, dysphasia, odynophagia, melena, hematochezia, dysuria, hematuria, rash, seizure activity, orthopnea, PND, pedal edema, claudication. Remaining systems are negative.  Physical Exam:   Blood pressure 120/60, pulse (!) 116, height 5\' 7"  (1.702 m), weight 139 lb (63 kg), last menstrual period 07/22/2020, SpO2 99 %.  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  Personally reviewed  A/P  1 Palpitations-symptoms sound likely to be sinus tachycardia related to her anemia.  However she continues to have occasional milder symptoms in her throat.  We will arrange an echocardiogram to assess LV function.  If she has worsening symptoms in the future we will consider placing a 30-day monitor.  I also discussed potentially purchasing an apple watch to monitor her rhythm.  2 microcytic anemia-follow-up primary care.  Olga Millers,  MD

## 2020-08-18 NOTE — Patient Instructions (Signed)
  Testing/Procedures:  Your physician has requested that you have an echocardiogram. Echocardiography is a painless test that uses sound waves to create images of your heart. It provides your doctor with information about the size and shape of your heart and how well your heart's chambers and valves are working. This procedure takes approximately one hour. There are no restrictions for this procedure. [address]   Follow-Up: At [facility], you and your health needs are our priority.  As part of our continuing mission to provide you with exceptional heart care, we have created designated Provider Care Teams.  These Care Teams include your primary Cardiologist (physician) and Advanced Practice Providers (APPs -  Physician Assistants and Nurse Practitioners) who all work together to provide you with the care you need, when you need it.  We recommend signing up for the patient portal called "MyChart".  Sign up information is provided on this After Visit Summary.  MyChart is used to connect with patients for Virtual Visits (Telemedicine).  Patients are able to view lab/test results, encounter notes, upcoming appointments, etc.  Non-urgent messages can be sent to your provider as well.   To learn more about what you can do with MyChart, go to [email or web address].    Your next appointment:    AS NEEDED 

## 2020-09-05 ENCOUNTER — Other Ambulatory Visit (HOSPITAL_COMMUNITY): Payer: 59

## 2020-09-09 ENCOUNTER — Ambulatory Visit (INDEPENDENT_AMBULATORY_CARE_PROVIDER_SITE_OTHER): Payer: 59

## 2020-09-09 ENCOUNTER — Encounter (HOSPITAL_COMMUNITY): Payer: Self-pay | Admitting: Emergency Medicine

## 2020-09-09 ENCOUNTER — Ambulatory Visit (HOSPITAL_COMMUNITY)
Admission: EM | Admit: 2020-09-09 | Discharge: 2020-09-09 | Disposition: A | Discharge status: Home / Self Care | Payer: 59 | Attending: Emergency Medicine | Admitting: Emergency Medicine

## 2020-09-09 ENCOUNTER — Other Ambulatory Visit: Payer: Self-pay

## 2020-09-09 DIAGNOSIS — M549 Dorsalgia, unspecified: Secondary | ICD-10-CM | POA: Diagnosis not present

## 2020-09-09 HISTORY — DX: Dermatitis, unspecified: L30.9

## 2020-09-09 MED ORDER — IBUPROFEN 600 MG PO TABS: 600.0000 mg | ORAL_TABLET | Freq: Four times a day (QID) | ORAL | 0 refills | Status: AC | PRN

## 2020-09-09 MED ORDER — TIZANIDINE HCL 4 MG PO TABS: 4.0000 mg | ORAL_TABLET | Freq: Three times a day (TID) | ORAL | 0 refills | Status: AC | PRN

## 2020-09-09 NOTE — ED Provider Notes (Signed)
HPI  SUBJECTIVE:  Kimberly Hays is a right-handed 20 y.o. female who presents with 4 to 5 days of intermittent, hours long, sharp, achy left upper back pain.  She states that she felt febrile and had a cough last week before the back pain started, but this has since resolved.  No chest pain, wheezing, shortness of breath.  No radiation of this pain down her arm, to the front of her chest.  She states that her left neck is also sore.  No Antipyretic since yesterday.  No Antibiotics in the past f74months.  She has never had symptoms like this before.  She tried ibuprofen 400 mg daily with improvement in her symptoms.  Symptoms worse with taking a deep breath and, and with a rowing motion.  No calf pain, swelling, hemoptysis, surgery in the past 4 weeks, recent immobilization, OCP use, history of PE, DVT, cancer.  She has a past medical history of Cushing syndrome and has a history of prolonged steroid use, anemia, palpitations.  No osteoporosis.  LMP: Last week.  Denies possibility being pregnant.  LKG:[facility]     Past Medical History:  Diagnosis Date   Anemia    Cushing syndrome (HCC)    per pt report had cushing syndrome a year ago, reports now it has cleared up   Eczema     Past Surgical History:  Procedure Laterality Date   STOMACH SURGERY     38 days old     Family History  Problem Relation Age of Onset   Anemia Mother    Diabetes Father     Social History   Tobacco Use   Smoking status: Never   Smokeless tobacco: Never  Vaping Use   Vaping Use: Never used  Substance Use Topics   Alcohol use: No   Drug use: No    No current facility-administered medications for this encounter.  Current Outpatient Medications:    ibuprofen (ADVIL) 600 MG tablet, Take 1 tablet (600 mg total) by mouth every 6 (six) hours as needed., Disp: 30 tablet, Rfl: 0   tiZANidine (ZANAFLEX) 4 MG tablet, Take 1 tablet (4 mg total) by mouth every 8 (eight) hours as  needed for muscle spasms., Disp: 30 tablet, Rfl: 0   Crisaborole 2 % OINT, Apply topically 2 (two) times daily., Disp: , Rfl:    Dupilumab (DUPIXENT [place]), Inject into the skin., Disp: , Rfl:    loperamide (IMODIUM) 2 MG capsule, Take 2 mg by mouth daily as needed for diarrhea or loose stools. As Needed, Disp: , Rfl:   No Known Allergies   ROS  As noted in HPI.   Physical Exam  BP 120/85 (BP Location: Right Arm)   Pulse 98   Temp 98.3 F (36.8 C) (Oral)   Resp 16   LMP 09/01/2020   SpO2 100%   Constitutional: Well developed, well nourished, no acute distress Eyes:  EOMI, conjunctiva normal bilaterally HENT: Normocephalic, atraumatic,mucus membranes moist Respiratory: Normal inspiratory effort, lungs clear bilaterally. Cardiovascular: Normal rate, regular rhythm no murmurs rubs or gallops GI: nondistended skin: No rash over posterior chest/shoulder, skin intact Back: Positive left trapezial tenderness, muscle spasm.  Positive tenderness over the scapula and left latissimus.  Musculoskeletal: Calves symmetric, nontender, no edema Neurologic: Alert & oriented x 3, no focal neuro deficits Psychiatric: Speech and behavior appropriate   ED Course   Medications - No data to display  Orders Placed This Encounter  Procedures   DG Chest  2 View    Standing Status:   Standing    Number of Occurrences:   1    Order Specific Question:   Reason for Exam (SYMPTOM  OR DIAGNOSIS REQUIRED)    Answer:   L upper back pain, r/o PNA, PTX    No results found for this or any previous visit (from the past 24 hour(s)). DG Chest 2 View  Result Date: 09/09/2020 CLINICAL DATA:  20 year old female with left upper back pain EXAM: CHEST - 2 VIEW COMPARISON:  Chest radiograph, 03/27/2018 FINDINGS: Cardiac silhouette is within normal limits given patient rotation. The lungs are well inflated. No focal consolidation or mass. No pleural effusion or pneumothorax. Thoracolumbar rotoscoliosis. No acute  osseous abnormality. IMPRESSION: No active cardiopulmonary disease. Electronically Signed   By: Roanna Banning MD   On: 09/09/2020 08:57    ED Clinical Impression  1. Upper back pain on left side      ED Assessment/Plan  Suspect musculoskeletal cause from all the coughing that she did last week, however will check an x-ray to rule out pneumothorax, pneumonia.  Doubt ACS.  Patient is PERC negative.  Doubt PE.  If x-ray is negative, will treat as a musculoskeletal cause with Tylenol/ibuprofen, Zanaflex.  Follow-up with PMD as needed.  ER return precautions given.  Reviewed imaging independently.  Normal chest x-ray.  see radiology report for full details.  Patient with normal chest x-ray.  Presentation consistent with musculoskeletal pain.  Plan as above  Discussed  imaging, MDM, treatment plan, and plan for follow-up with patient. Discussed sn/sx that should prompt return to the ED. patient agrees with plan.   Meds ordered this encounter  Medications   ibuprofen (ADVIL) 600 MG tablet    Sig: Take 1 tablet (600 mg total) by mouth every 6 (six) hours as needed.    Dispense:  30 tablet    Refill:  0   tiZANidine (ZANAFLEX) 4 MG tablet    Sig: Take 1 tablet (4 mg total) by mouth every 8 (eight) hours as needed for muscle spasms.    Dispense:  30 tablet    Refill:  0       *This clinic note was created using Scientist, clinical (histocompatibility and immunogenetics). Therefore, there may be occasional mistakes despite careful proofreading.  ?    Domenick Gong, MD 09/09/20 [phone]

## 2020-09-09 NOTE — Discharge Instructions (Addendum)
Take 600 mg of ibuprofen, 1000 mg of Tylenol together 3-4 times a day.  Zanaflex  for muscle spasms.  You can try deep tissue massage and Epson salt soaks.  Your x-ray was normal.  There is no evidence of an infection or collapsed lung.

## 2020-09-09 NOTE — ED Triage Notes (Signed)
Left upper back pain, hurts worse with deep breaths and movement of left arm from front to back of body causes pain.  Pain for one week.  No known injury

## 2020-09-16 ENCOUNTER — Ambulatory Visit (HOSPITAL_COMMUNITY): Payer: 59 | Attending: Internal Medicine

## 2020-09-16 ENCOUNTER — Other Ambulatory Visit: Payer: Self-pay

## 2020-09-16 DIAGNOSIS — R002 Palpitations: Secondary | ICD-10-CM | POA: Diagnosis present

## 2020-09-16 LAB — ECHOCARDIOGRAM COMPLETE
Area-P 1/2: 4.44 cm2
S' Lateral: 2.7 cm

## 2020-10-15 ENCOUNTER — Other Ambulatory Visit: Payer: Self-pay

## 2020-10-15 ENCOUNTER — Encounter (HOSPITAL_COMMUNITY): Payer: Self-pay | Admitting: Emergency Medicine

## 2020-10-15 ENCOUNTER — Ambulatory Visit (HOSPITAL_COMMUNITY)
Admission: EM | Admit: 2020-10-15 | Discharge: 2020-10-15 | Disposition: A | Discharge status: Home / Self Care | Payer: 59 | Attending: Family Medicine | Admitting: Family Medicine

## 2020-10-15 DIAGNOSIS — R0781 Pleurodynia: Secondary | ICD-10-CM

## 2020-10-15 LAB — POCT URINALYSIS DIPSTICK, ED / UC
Bilirubin Urine: NEGATIVE
Glucose, UA: NEGATIVE mg/dL
Hgb urine dipstick: NEGATIVE
Leukocytes,Ua: NEGATIVE
Nitrite: NEGATIVE
Protein, ur: NEGATIVE mg/dL
Specific Gravity, Urine: >=1.03 (ref 1.005–1.030)
Urobilinogen, UA: 2 mg/dL — ABNORMAL HIGH (ref 0.0–1.0)
pH: 6 (ref 5.0–8.0)

## 2020-10-15 LAB — POC URINE PREG, ED: Preg Test, Ur: NEGATIVE

## 2020-10-15 MED ORDER — NAPROXEN 500 MG PO TABS: 500.0000 mg | ORAL_TABLET | Freq: Two times a day (BID) | ORAL | 0 refills | Status: AC | PRN

## 2020-10-15 NOTE — ED Triage Notes (Signed)
Pt c/o left flank pain that started yesterday that is worse with taking deep breaths. Repots had back pains recently seen here for it and gotten a massage that helped. Denies urinary or bowel problems. Denies falls or injuries or lifting but has been going to the gym,.

## 2020-10-18 NOTE — ED Provider Notes (Signed)
[facility]    CSN: [id number] Arrival date & time: 10/15/20  1826      History   Chief Complaint Chief Complaint  Patient presents with   Flank Pain    HPI Kimberly Hays is a 20 y.o. female.   Patient presenting today with 1 day history of left lateral flank/rib pain that is worse with taking deep breaths or moving certain ways.  She states she works out almost every day but does not do any heavy lifting.  Denies injuring herself at any time recently.  She denies dysuria, hematuria, abdominal pain, nausea vomiting diarrhea or constipation.  No fevers, chills, body aches.  Not trying anything over-the-counter for symptoms.   Past Medical History:  Diagnosis Date   Anemia    Cushing syndrome (HCC)    per pt report had cushing syndrome a year ago, reports now it has cleared up   Eczema     There are no problems to display for this patient.   Past Surgical History:  Procedure Laterality Date   STOMACH SURGERY     82 days old     OB History   No obstetric history on file.      Home Medications    Prior to Admission medications   Medication Sig Start Date End Date Taking? Authorizing Provider  naproxen (NAPROSYN) 500 MG tablet Take 1 tablet (500 mg total) by mouth 2 (two) times daily as needed. 10/15/20  Yes Particia Nearing, PA-C  Crisaborole 2 % OINT Apply topically 2 (two) times daily.    [provider]  Dupilumab (DUPIXENT [place]) Inject into the skin.    [provider]  ibuprofen (ADVIL) 600 MG tablet Take 1 tablet (600 mg total) by mouth every 6 (six) hours as needed. 09/09/20   Domenick Gong, MD  loperamide (IMODIUM) 2 MG capsule Take 2 mg by mouth daily as needed for diarrhea or loose stools. As Needed    [provider]  tiZANidine (ZANAFLEX) 4 MG tablet Take 1 tablet (4 mg total) by mouth every 8 (eight) hours as needed for muscle spasms. 09/09/20   Domenick Gong, MD    Family History Family History  Problem  Relation Age of Onset   Anemia Mother    Diabetes Father     Social History Social History   Tobacco Use   Smoking status: Never   Smokeless tobacco: Never  Vaping Use   Vaping Use: Never used  Substance Use Topics   Alcohol use: No   Drug use: No     Allergies   Patient has no known allergies.   Review of Systems Review of Systems Per HPI  Physical Exam Triage Vital Signs ED Triage Vitals  Enc Vitals Group     BP 10/15/20 1851 93/61     Pulse Rate 10/15/20 1851 99     Resp 10/15/20 1851 17     Temp 10/15/20 1851 98.4 F (36.9 C)     Temp Source 10/15/20 1851 Oral     SpO2 10/15/20 1851 98 %     Weight --      Height --      Head Circumference --      Peak Flow --      Pain Score 10/15/20 1849 6     Pain Loc --      Pain Edu? --      Excl. in GC? --    No data found.  Updated Vital Signs BP  93/61 (BP Location: Left Arm)   Pulse 99   Temp 98.4 F (36.9 C) (Oral)   Resp 17   LMP 10/04/2020   SpO2 98%   Visual Acuity Right Eye Distance:   Left Eye Distance:   Bilateral Distance:    Right Eye Near:   Left Eye Near:    Bilateral Near:     Physical Exam Vitals and nursing note reviewed.  Constitutional:      Appearance: Normal appearance. She is not ill-appearing.  HENT:     Head: Atraumatic.     Mouth/Throat:     Mouth: Mucous membranes are moist.  Eyes:     Extraocular Movements: Extraocular movements intact.     Conjunctiva/sclera: Conjunctivae normal.  Cardiovascular:     Rate and Rhythm: Normal rate and regular rhythm.     Heart sounds: Normal heart sounds.  Pulmonary:     Effort: Pulmonary effort is normal. No respiratory distress.     Breath sounds: Normal breath sounds. No wheezing or rales.  Abdominal:     General: Bowel sounds are normal. There is no distension.     Palpations: Abdomen is soft.     Tenderness: There is no abdominal tenderness. There is no right CVA tenderness, left CVA tenderness or guarding.   Musculoskeletal:        General: Tenderness present. No swelling, deformity or signs of injury. Normal range of motion.     Cervical back: Normal range of motion and neck supple.     Comments: Very minimal diffuse left lateral lower rib tenderness to palpation without bruising, swelling, bony deformity palpable  Skin:    General: Skin is warm and dry.  Neurological:     Mental Status: She is alert and oriented to person, place, and time.  Psychiatric:        Mood and Affect: Mood normal.        Thought Content: Thought content normal.        Judgment: Judgment normal.     UC Treatments / Results  Labs (all labs ordered are listed, but only abnormal results are displayed) Labs Reviewed  POCT URINALYSIS DIPSTICK, ED / UC - Abnormal; Notable for the following components:      Result Value   Ketones, ur TRACE (*)    Urobilinogen, UA 2.0 (*)    All other components within normal limits  POC URINE PREG, ED    EKG   Radiology No results found.  Procedures Procedures (including critical care time)  Medications Ordered in UC Medications - No data to display  Initial Impression / Assessment and Plan / UC Course  I have reviewed the triage vital signs and the nursing notes.  Pertinent labs & imaging results that were available during my care of the patient were reviewed by me and considered in my medical decision making (see chart for details).     Vitals and exam reassuring, UA without evidence of kidney stone or urinary tract infection.  Suspect musculoskeletal in nature.  We will treat with naproxen, lidocaine patches, heat, stretches.  Follow-up if worsening or not resolving.  Final Clinical Impressions(s) / UC Diagnoses   Final diagnoses:  Rib pain on left side   Discharge Instructions   None    ED Prescriptions     Medication Sig Dispense Auth. Provider   naproxen (NAPROSYN) 500 MG tablet Take 1 tablet (500 mg total) by mouth 2 (two) times daily as needed. 30  tablet Particia Nearing, [place]  PDMP not reviewed this encounter.   Particia Nearing, [place] 10/18/20 1704

## 2021-10-19 ENCOUNTER — Other Ambulatory Visit: Payer: Self-pay

## 2021-10-19 ENCOUNTER — Ambulatory Visit (HOSPITAL_COMMUNITY)
Admission: EM | Admit: 2021-10-19 | Discharge: 2021-10-19 | Disposition: A | Discharge status: Home / Self Care | Payer: Managed Care, Other (non HMO) | Attending: Emergency Medicine | Admitting: Emergency Medicine

## 2021-10-19 ENCOUNTER — Encounter (HOSPITAL_COMMUNITY): Payer: Self-pay | Admitting: *Deleted

## 2021-10-19 DIAGNOSIS — R42 Dizziness and giddiness: Secondary | ICD-10-CM | POA: Diagnosis not present

## 2021-10-19 LAB — CBC WITH DIFFERENTIAL/PLATELET
Abs Immature Granulocytes: 0 10*3/uL (ref 0.00–0.07)
Basophils Absolute: 0 10*3/uL (ref 0.0–0.1)
Basophils Relative: 0 %
Eosinophils Absolute: 0 10*3/uL (ref 0.0–0.5)
Eosinophils Relative: 1 %
HCT: 35.4 % — ABNORMAL LOW (ref 36.0–46.0)
Hemoglobin: 10.9 g/dL — ABNORMAL LOW (ref 12.0–15.0)
Immature Granulocytes: 0 %
Lymphocytes Relative: 38 %
Lymphs Abs: 1.1 10*3/uL (ref 0.7–4.0)
MCH: 19.7 pg — ABNORMAL LOW (ref 26.0–34.0)
MCHC: 30.8 g/dL (ref 30.0–36.0)
MCV: 64 fL — ABNORMAL LOW (ref 80.0–100.0)
Monocytes Absolute: 0.2 10*3/uL (ref 0.1–1.0)
Monocytes Relative: 7 %
Neutro Abs: 1.6 10*3/uL — ABNORMAL LOW (ref 1.7–7.7)
Neutrophils Relative %: 54 %
Platelets: 219 10*3/uL (ref 150–400)
RBC: 5.53 MIL/uL — ABNORMAL HIGH (ref 3.87–5.11)
RDW: 15.1 % (ref 11.5–15.5)
WBC: 3 10*3/uL — ABNORMAL LOW (ref 4.0–10.5)
nRBC: 0 % (ref 0.0–0.2)

## 2021-10-19 LAB — COMPREHENSIVE METABOLIC PANEL
ALT: 12 U/L (ref 0–44)
AST: 17 U/L (ref 15–41)
Albumin: 4.2 g/dL (ref 3.5–5.0)
Alkaline Phosphatase: 51 U/L (ref 38–126)
Anion gap: 6 (ref 5–15)
BUN: 13 mg/dL (ref 6–20)
CO2: 26 mmol/L (ref 22–32)
Calcium: 9.3 mg/dL (ref 8.9–10.3)
Chloride: 105 mmol/L (ref 98–111)
Creatinine, Ser: 0.56 mg/dL (ref 0.44–1.00)
GFR, Estimated: >60 mL/min (ref >60)
Glucose, Bld: 92 mg/dL (ref 70–99)
Potassium: 4.1 mmol/L (ref 3.5–5.1)
Sodium: 137 mmol/L (ref 135–145)
Total Bilirubin: 0.5 mg/dL (ref 0.3–1.2)
Total Protein: 7.9 g/dL (ref 6.5–8.1)

## 2021-10-19 LAB — POC URINE PREG, ED: Preg Test, Ur: NEGATIVE

## 2021-10-19 LAB — CBG MONITORING, ED: Glucose-Capillary: 81 mg/dL (ref 70–99)

## 2021-10-19 NOTE — Discharge Instructions (Addendum)
Your lab work was normal. Continue your iron supplement.  I recommend scheduling an appointment with your primary care provider regarding your symptoms.  Please go to the emergency department if symptoms worsen.

## 2021-10-19 NOTE — ED Triage Notes (Signed)
Pt reports she had Hep -B shot 2 weeks . Pt has felt weak,dizzy . Pt has Hx of Anemia last checked was in Feb 23.

## 2021-10-19 NOTE — ED Provider Notes (Signed)
[facility]    CSN: [id number] Arrival date & time: 10/19/21  0801      History   Chief Complaint Chief Complaint  Patient presents with   Weakness   Dizziness    HPI Kimberly Hays is a 21 y.o. female.  Presents with 1 week history of intermittent lightheadedness. Reports occasionally she will feel like she is going to fall, but doesn't Symptoms are not related to head motion or position.  Reports eating enough and drinking lots of water Denies new medications.  She has history of anemia, labs in February were 10.1 Takes B12 and iron supplements daily No history of diabetes  LMP 8/10.  Regular menstrual cycles. History of Cushing syndrome that resolved No recent weight changes  Received hepatitis B vaccination 2 weeks ago and thought her symptoms were due to this.  Past Medical History:  Diagnosis Date   Anemia    Cushing syndrome (HCC)    per pt report had cushing syndrome a year ago, reports now it has cleared up   Eczema     There are no problems to display for this patient.   Past Surgical History:  Procedure Laterality Date   STOMACH SURGERY     24 days old     OB History   No obstetric history on file.      Home Medications    Prior to Admission medications   Medication Sig Start Date End Date Taking? Authorizing Provider  cyanocobalamin (VITAMIN B12) 250 MCG tablet Take 250 mcg by mouth daily.   Yes [provider]  ferrous sulfate 325 (65 FE) MG tablet Take 325 mg by mouth daily with breakfast.   Yes [provider]  Crisaborole 2 % OINT Apply topically 2 (two) times daily.    [provider]  Dupilumab (DUPIXENT [place]) Inject into the skin.    [provider]  ibuprofen (ADVIL) 600 MG tablet Take 1 tablet (600 mg total) by mouth every 6 (six) hours as needed. 09/09/20   Domenick Gong, MD  loperamide (IMODIUM) 2 MG capsule Take 2 mg by mouth daily as needed for diarrhea or loose stools. As  Needed    [provider]  naproxen (NAPROSYN) 500 MG tablet Take 1 tablet (500 mg total) by mouth 2 (two) times daily as needed. 10/15/20   Particia Nearing, PA-C  tiZANidine (ZANAFLEX) 4 MG tablet Take 1 tablet (4 mg total) by mouth every 8 (eight) hours as needed for muscle spasms. 09/09/20   Domenick Gong, MD    Family History Family History  Problem Relation Age of Onset   Anemia Mother    Diabetes Father     Social History Social History   Tobacco Use   Smoking status: Never   Smokeless tobacco: Never  Vaping Use   Vaping Use: Never used  Substance Use Topics   Alcohol use: No   Drug use: No     Allergies   Patient has no known allergies.   Review of Systems Review of Systems  Neurological:  Positive for dizziness and weakness.   Per HPI  Physical Exam Triage Vital Signs ED Triage Vitals  Enc Vitals Group     BP 10/19/21 0824 119/76     Pulse Rate 10/19/21 0824 90     Resp 10/19/21 0824 20     Temp 10/19/21 0824 98.2 F (36.8 C)     Temp src --      SpO2 10/19/21 0824 94 %  Weight --      Height --      Head Circumference --      Peak Flow --      Pain Score 10/19/21 0826 0     Pain Loc --      Pain Edu? --      Excl. in GC? --    No data found.  Updated Vital Signs BP 119/76   Pulse 90   Temp 98.2 F (36.8 C)   Resp 20   LMP 09/17/2021   SpO2 94%    Physical Exam Vitals and nursing note reviewed.  Constitutional:      General: She is not in acute distress.    Appearance: She is well-developed.     Comments: Well appearing. No acute distress  HENT:     Right Ear: Tympanic membrane and ear canal normal.     Left Ear: Tympanic membrane and ear canal normal.     Mouth/Throat:     Mouth: Mucous membranes are moist.     Pharynx: Oropharynx is clear. No oropharyngeal exudate or posterior oropharyngeal erythema.  Eyes:     Extraocular Movements: Extraocular movements intact.     Conjunctiva/sclera: Conjunctivae normal.      Pupils: Pupils are equal, round, and reactive to light.  Cardiovascular:     Rate and Rhythm: Normal rate and regular rhythm.     Pulses: Normal pulses.     Heart sounds: Normal heart sounds.  Pulmonary:     Effort: Pulmonary effort is normal. No respiratory distress.     Breath sounds: Normal breath sounds.  Abdominal:     Palpations: Abdomen is soft.     Tenderness: There is no abdominal tenderness.  Musculoskeletal:        General: Normal range of motion.     Cervical back: Normal range of motion. No rigidity.  Lymphadenopathy:     Cervical: No cervical adenopathy.  Skin:    General: Skin is warm and dry.     Capillary Refill: Capillary refill takes less than 2 seconds.  Neurological:     General: No focal deficit present.     Mental Status: She is alert and oriented to person, place, and time.     Sensory: Sensation is intact.     Motor: Motor function is intact. No weakness or pronator drift.     Coordination: Coordination is intact.     Gait: Gait is intact.  Psychiatric:        Mood and Affect: Mood normal.     UC Treatments / Results  Labs (all labs ordered are listed, but only abnormal results are displayed) Labs Reviewed  CBC WITH DIFFERENTIAL/PLATELET - Abnormal; Notable for the following components:      Result Value   WBC 3.0 (*)    RBC 5.53 (*)    Hemoglobin 10.9 (*)    HCT 35.4 (*)    MCV 64.0 (*)    MCH 19.7 (*)    All other components within normal limits  COMPREHENSIVE METABOLIC PANEL  POC URINE PREG, ED  CBG MONITORING, ED    EKG   Radiology No results found.  Procedures Procedures   Medications Ordered in UC Medications - No data to display  Initial Impression / Assessment and Plan / UC Course  I have reviewed the triage vital signs and the nursing notes.  Pertinent labs & imaging results that were available during my care of the patient were reviewed by me and considered  in my medical decision making (see chart for  details).  Intermittent lightheadedness, could be due to anemia. We discussed that the hepatitis B vaccine is not likely the cause of her symptoms.  Patient had googled her symptoms and thought they were related to the shot.  Her symptoms did not start until a few days ago really.  Urine pregnancy negative. CBG 81.  Stat labs CBC and CMP pending as of 8:51 AM Patient reports she would like to wait for the labs.  10:23 AM: CBC WNL. Labs stable since feb. Hgb 10.9 CMP unremarkable.  She is stable with normal vitals, no current symptoms. Recommend follow up with primary care provider regarding symptoms. Return precautions discussed with ED precautions. Patient agrees to plan  Final Clinical Impressions(s) / UC Diagnoses   Final diagnoses:  Lightheadedness     Discharge Instructions      Your lab work was normal. Continue your iron supplement.  I recommend scheduling an appointment with your primary care provider regarding your symptoms.  Please go to the emergency department if symptoms worsen.     ED Prescriptions   None    PDMP not reviewed this encounter.   Mishti Swanton, Lurena Joiner, PA-C 10/19/21 1037

## 2021-10-24 ENCOUNTER — Encounter (HOSPITAL_COMMUNITY): Payer: Self-pay | Admitting: Emergency Medicine

## 2021-10-24 ENCOUNTER — Emergency Department (HOSPITAL_COMMUNITY)
Admission: EM | Admit: 2021-10-24 | Discharge: 2021-10-24 | Disposition: A | Discharge status: Home / Self Care | Payer: Commercial Managed Care - HMO | Attending: Emergency Medicine | Admitting: Emergency Medicine

## 2021-10-24 ENCOUNTER — Other Ambulatory Visit: Payer: Self-pay

## 2021-10-24 ENCOUNTER — Emergency Department (HOSPITAL_COMMUNITY): Payer: Commercial Managed Care - HMO

## 2021-10-24 DIAGNOSIS — R5383 Other fatigue: Secondary | ICD-10-CM | POA: Diagnosis not present

## 2021-10-24 DIAGNOSIS — R0789 Other chest pain: Secondary | ICD-10-CM | POA: Insufficient documentation

## 2021-10-24 DIAGNOSIS — R42 Dizziness and giddiness: Secondary | ICD-10-CM | POA: Insufficient documentation

## 2021-10-24 DIAGNOSIS — N9489 Other specified conditions associated with female genital organs and menstrual cycle: Secondary | ICD-10-CM | POA: Diagnosis not present

## 2021-10-24 LAB — CBC
HCT: 32.7 % — ABNORMAL LOW (ref 36.0–46.0)
Hemoglobin: 10.1 g/dL — ABNORMAL LOW (ref 12.0–15.0)
MCH: 19.7 pg — ABNORMAL LOW (ref 26.0–34.0)
MCHC: 30.9 g/dL (ref 30.0–36.0)
MCV: 63.7 fL — ABNORMAL LOW (ref 80.0–100.0)
Platelets: 210 10*3/uL (ref 150–400)
RBC: 5.13 MIL/uL — ABNORMAL HIGH (ref 3.87–5.11)
RDW: 14.9 % (ref 11.5–15.5)
WBC: 3.6 10*3/uL — ABNORMAL LOW (ref 4.0–10.5)
nRBC: 0 % (ref 0.0–0.2)

## 2021-10-24 LAB — BASIC METABOLIC PANEL
Anion gap: 9 (ref 5–15)
BUN: 11 mg/dL (ref 6–20)
CO2: 26 mmol/L (ref 22–32)
Calcium: 9.5 mg/dL (ref 8.9–10.3)
Chloride: 104 mmol/L (ref 98–111)
Creatinine, Ser: 0.51 mg/dL (ref 0.44–1.00)
GFR, Estimated: >60 mL/min (ref >60)
Glucose, Bld: 124 mg/dL — ABNORMAL HIGH (ref 70–99)
Potassium: 4.3 mmol/L (ref 3.5–5.1)
Sodium: 139 mmol/L (ref 135–145)

## 2021-10-24 LAB — I-STAT BETA HCG BLOOD, ED (MC, WL, AP ONLY): I-stat hCG, quantitative: <5 m[IU]/mL (ref <5)

## 2021-10-24 LAB — TROPONIN I (HIGH SENSITIVITY)
Troponin I (High Sensitivity): <2 ng/L (ref <18)
Troponin I (High Sensitivity): 3 ng/L (ref <18)

## 2021-10-24 NOTE — Discharge Instructions (Addendum)
Please follow-up with your primary care physician.  Make sure you are drinking plenty of water and eating regular meals.  I have included some information for you that fatigue.  Please review this.  Does not necessarily medication that will help with this especially since your blood pressure other vital signs are normal here.  Your blood counts, EKG, cardiac enzymes, electrolytes all look normal here.  Your chest x-ray was also reassuringly normal.  If you are taking a muscle relaxers I would stop taking this as these can cause dizziness/lightheadedness.

## 2021-10-24 NOTE — ED Triage Notes (Signed)
Pt reported she has been feeling light headed about 2 week, also was having squeezing sensation in her heart, which occur yesterday at and earlier this week. Today in the emergency room she is having on and off dizziness, but no squeezing in her chest.

## 2021-10-24 NOTE — ED Provider Notes (Signed)
[facility] EMERGENCY DEPARTMENT Provider Note   CSN: [id number] Arrival date & time: 10/24/21  1036     History  Chief Complaint  Patient presents with   Dizziness    Kimberly Hays is a 21 y.o. female.   Dizziness Patient is a 21 year old female with past medical history significant for beta thalassemia, eczema, anemia  She is presented emergency room today with complaints of feeling lightheaded daily over the past 2 weeks.  She states that this can happen at any time in any position.  She has not had any syncope or near syncope.  She states that it will occur for 1 to 2 minutes.  She states she is eating plenty of food and drinking plenty of water.  She states that 2 days ago she felt some tightness in her chest but no nausea vomiting or shortness of breath.    She denies any chest pain today.  No recent surgeries, hospitalization, long travel, hemoptysis, estrogen containing OCP, cancer history.  No unilateral leg swelling.  No history of PE or VTE.      Home Medications Prior to Admission medications   Medication Sig Start Date End Date Taking? Authorizing Provider  Crisaborole 2 % OINT Apply topically 2 (two) times daily.    [provider]  cyanocobalamin (VITAMIN B12) 250 MCG tablet Take 250 mcg by mouth daily.    [provider]  Dupilumab (DUPIXENT [place]) Inject into the skin.    [provider]  ferrous sulfate 325 (65 FE) MG tablet Take 325 mg by mouth daily with breakfast.    [provider]  ibuprofen (ADVIL) 600 MG tablet Take 1 tablet (600 mg total) by mouth every 6 (six) hours as needed. 09/09/20   Domenick Gong, MD  loperamide (IMODIUM) 2 MG capsule Take 2 mg by mouth daily as needed for diarrhea or loose stools. As Needed    [provider]  naproxen (NAPROSYN) 500 MG tablet Take 1 tablet (500 mg total) by mouth 2 (two) times daily as needed. 10/15/20   Particia Nearing, PA-C  tiZANidine  (ZANAFLEX) 4 MG tablet Take 1 tablet (4 mg total) by mouth every 8 (eight) hours as needed for muscle spasms. 09/09/20   Domenick Gong, MD      Allergies    Patient has no allergy information on record.    Review of Systems   Review of Systems  Neurological:  Positive for dizziness.    Physical Exam Updated Vital Signs BP 110/69   Pulse 76   Temp 98.2 F (36.8 C) (Oral)   Resp 14   Ht 5\' 7"  (1.702 m)   Wt 63.5 kg   LMP 09/17/2021 Comment: patient states no chance of pregnancy today 10/24/21  SpO2 100%   BMI 21.93 kg/m  Physical Exam Vitals and nursing note reviewed.  Constitutional:      General: She is not in acute distress.    Appearance: She is not ill-appearing.     Comments: Pleasant well-appearing 21 year old.  In no acute distress.  Sitting comfortably in bed.  Able answer questions appropriately follow commands. No increased work of breathing. Speaking in full sentences.   HENT:     Head: Normocephalic and atraumatic.     Nose: Nose normal.     Mouth/Throat:     Mouth: Mucous membranes are moist.  Eyes:     General: No scleral icterus. Cardiovascular:     Rate and Rhythm: Normal rate and regular rhythm.  Pulses: Normal pulses.     Heart sounds: Normal heart sounds.  Pulmonary:     Effort: Pulmonary effort is normal. No respiratory distress.     Breath sounds: No wheezing.  Abdominal:     Palpations: Abdomen is soft.     Tenderness: There is no abdominal tenderness. There is no guarding or rebound.  Musculoskeletal:     Cervical back: Normal range of motion.     Right lower leg: No edema.     Left lower leg: No edema.  Skin:    General: Skin is warm and dry.     Capillary Refill: Capillary refill takes less than 2 seconds.  Neurological:     Mental Status: She is alert. Mental status is at baseline.  Psychiatric:        Mood and Affect: Mood normal.        Behavior: Behavior normal.     ED Results / Procedures / Treatments   Labs (all labs  ordered are listed, but only abnormal results are displayed) Labs Reviewed  BASIC METABOLIC PANEL - Abnormal; Notable for the following components:      Result Value   Glucose, Bld 124 (*)    All other components within normal limits  CBC - Abnormal; Notable for the following components:   WBC 3.6 (*)    RBC 5.13 (*)    Hemoglobin 10.1 (*)    HCT 32.7 (*)    MCV 63.7 (*)    MCH 19.7 (*)    All other components within normal limits  I-STAT BETA HCG BLOOD, ED ([facility], [facility], [facility] ONLY)  TROPONIN I (HIGH SENSITIVITY)  TROPONIN I (HIGH SENSITIVITY)    EKG EKG Interpretation  Date/Time:  Saturday October 24 2021 14:15:56 EDT Ventricular Rate:  71 PR Interval:  138 QRS Duration: 84 QT Interval:  408 QTC Calculation: 443 R Axis:   37 Text Interpretation: Normal sinus rhythm Non-specific ST-t changes No significant change since last tracing Confirmed by Cathren Laine ([id number]) on 10/24/2021 2:18:20 PM  Radiology DG Chest 2 View  Result Date: 10/24/2021 CLINICAL DATA:  Chest pain, dizziness EXAM: CHEST - 2 VIEW COMPARISON:  09/09/2020 FINDINGS: Cardiac size is within normal limits. Lung fields are clear of any infiltrates or pulmonary edema. There is no pleural effusion or pneumothorax. Pectus excavatum deformity is noted. Dextroscoliosis is seen in thoracic spine. IMPRESSION: No active cardiopulmonary disease. Electronically Signed   By: Ernie Avena M.D.   On: 10/24/2021 12:06    Procedures Procedures    Medications Ordered in ED Medications - No data to display  ED Course/ Medical Decision Making/ A&P                           Medical Decision Making Amount and/or Complexity of Data Reviewed Labs: ordered. Radiology: ordered.   This patient presents to the ED for concern of lightheadedness, this involves a number of treatment options, and is a complaint that carries with it a moderate risk of complications and morbidity.  The differential diagnosis includes The differential  diagnosis of weakness includes but is not limited to neurologic causes (GBS, myasthenia gravis, CVA, MS, ALS, transverse myelitis, spinal cord injury, CVA, botulism, ) and other causes: ACS, Arrhythmia, syncope, orthostatic hypotension, sepsis, hypoglycemia, electrolyte disturbance, hypothyroidism, respiratory failure, symptomatic anemia, dehydration, heat injury, polypharmacy, malignancy.  Hypoglycemia, arrhythmia, anemia all considered as part of the differential.  Co morbidities: Discussed in HPI   Brief History:  Patient is a 21 year old female with past medical history significant for beta thalassemia, eczema, anemia  She is presented emergency room today with complaints of feeling lightheaded daily over the past 2 weeks.  She states that this can happen at any time in any position.  She has not had any syncope or near syncope.  She states that it will occur for 1 to 2 minutes.  She states she is eating plenty of food and drinking plenty of water.  She states that 2 days ago she felt some tightness in her chest but no nausea vomiting or shortness of breath.    She denies any chest pain today.  No recent surgeries, hospitalization, long travel, hemoptysis, estrogen containing OCP, cancer history.  No unilateral leg swelling.  No history of PE or VTE.    EMR reviewed including pt PMHx, past surgical history and past visits to ER.   See HPI for more details   Lab Tests:   I ordered and independently interpreted labs. Labs notable for chronic anemia present.  No significant changes.  Acid Hg negative pregnancy.  Troponin x2 within normal limits.  BMP unremarkable   Imaging Studies:  NAD. I personally reviewed all imaging studies and no acute abnormality found. I agree with radiology interpretation. Chest x-ray unremarkable   Cardiac Monitoring:  NA EKG non-ischemic   Medicines ordered:  Patient is not currently having any symptoms.  No medications provided  Critical  Interventions:     Consults/Attending Physician   I discussed this case with my attending physician who cosigned this note including patient's presenting symptoms, physical exam, and planned diagnostics and interventions. Attending physician stated agreement with plan or made changes to plan which were implemented.   Reevaluation:  After the interventions noted above I re-evaluated patient and found that they have :stayed the same   Social Determinants of Health:      Problem List / ED Course:  Patient had intermittent lightheadedness over the past 2 weeks.  No pain associated with this other than some chest pressure yesterday.  Troponins x2 within normal limits.  Reassuring work-up.  Vital signs within normal limits no tachycardia hypotension and SPO2 has been 100% on room air.  PERC negative and low suspicion for PE as she has not had any shortness of breath hemoptysis difficulty breathing, or any chest pain today.  Doubt ectopic with negative pregnancy test.  Given very strict return precautions.  She will follow-up with her primary care physician expediently.   Dispostion:  After consideration of the diagnostic results and the patients response to treatment, I feel that the patent would benefit from discharge home with close follow-up.   Final Clinical Impression(s) / ED Diagnoses Final diagnoses:  Light headed  Other fatigue    Rx / DC Orders ED Discharge Orders     None         Tedd Sias, [place] 10/24/21 1529    Lajean Saver, MD 10/24/21 1534

## 2021-10-24 NOTE — ED Triage Notes (Signed)
Patient here with complaint of lightheadedness that started several weeks ago, now also an intermittent squeezing sensation in her chest that started a few days ago. Denies any changes today, states she came today because she is off work. Patient is alert, oriented, and in no apparent distress at this time.

## 2022-07-09 IMAGING — MR MR HEAD WO/W CM
12 series · 48 of 48 positions shown · IV contrast (multihance)
Comparison: None.

CLINICAL DATA: Headache and dizziness for 1 month. Cushing syndrome
was diagnosed 5 months ago.

EXAM:
MRI HEAD WITHOUT AND WITH CONTRAST
TECHNIQUE: Multiplanar, multiecho pulse sequences of the brain and surrounding
structures were obtained without and with intravenous contrast.
CONTRAST:  15mL MULTIHANCE GADOBENATE DIMEGLUMINE 529 MG/ML IV SOLN

[Series 2: T1 · sagittal · 5.0mm · 0.45mm/px · 2 of 22 slices shown]
[im 1/22]
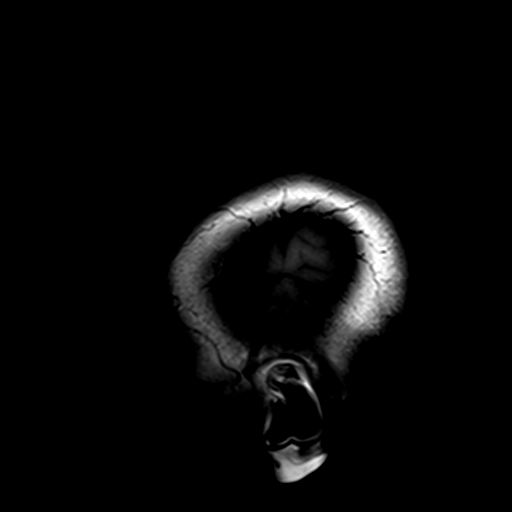
[im 22/22]
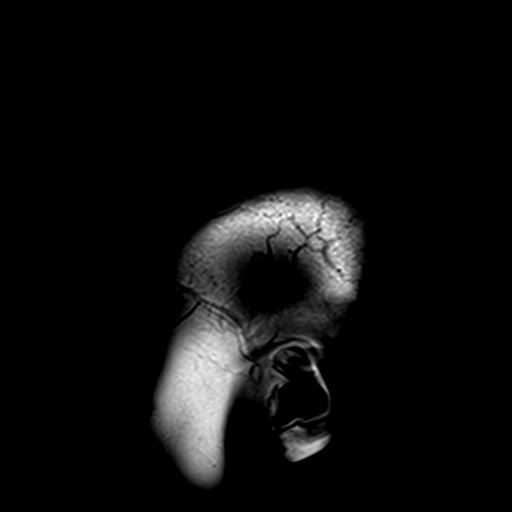

[Series 3: DWI · axial · 3.0mm · 1.80mm/px · z∈[-52,+95]mm · 7 of 100 slices shown (1 of 4)]
[im 1/100]
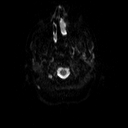
[im 17/100]
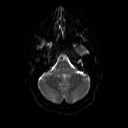
[im 34/100]
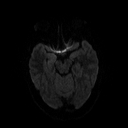
[im 50/100]
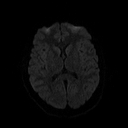
[im 67/100]
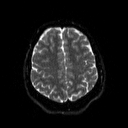
[im 83/100]
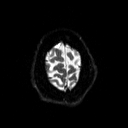
[im 100/100]
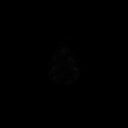

[Series 4: DWI · axial · 3.0mm · 1.80mm/px · z∈[-52,+95]mm · 3 of 50 slices shown (2 of 4)]
[im 1/50]
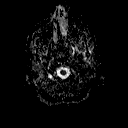
[im 25/50]
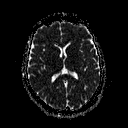
[im 50/50]
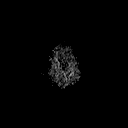

[Series 5: DWI · coronal · 5.0mm · 1.80mm/px · 5 of 67 slices shown (3 of 4)]
[im 1/67]
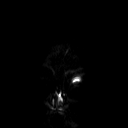
[im 17/67]
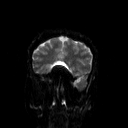
[im 34/67]
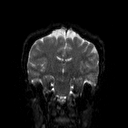
[im 50/67]
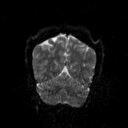
[im 67/67]
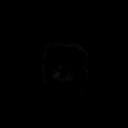

[Series 6: DWI · coronal · 5.0mm · 1.80mm/px · 2 of 35 slices shown (4 of 4)]
[im 1/35]
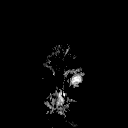
[im 35/35]
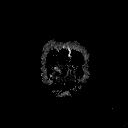

[Series 7: T2 · axial · 5.0mm · 0.60mm/px · 1 of 22 slices shown (1 of 2)]
[im 1/22]
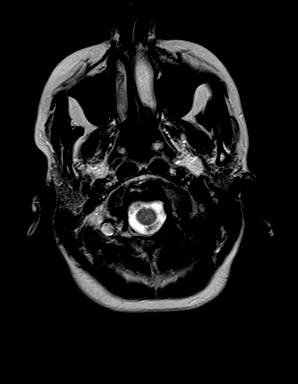

[Series 8: FLAIR · axial · 3.0mm · 0.45mm/px · z∈[-55,+84]mm · 2 of 31 slices shown]
[im 1/31]
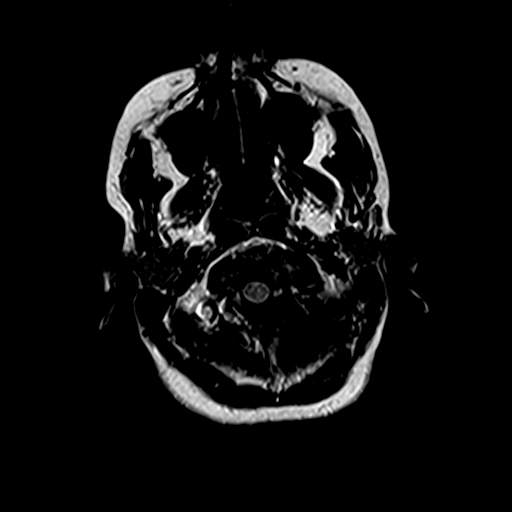
[im 31/31]
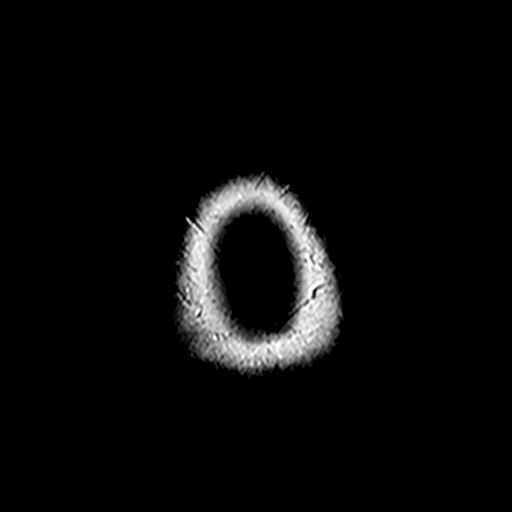

[Series 10: swi_images · axial · 4.0mm · 0.90mm/px · z∈[-54,+86]mm · 2 of 36 slices shown]
[im 1/36]
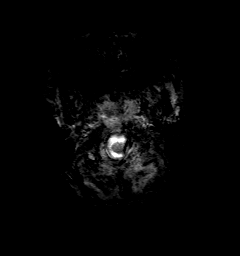
[im 36/36]
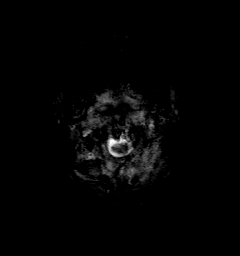

[Series 11: t1_mpr_tra · axial · 1.0mm · 0.75mm/px · z∈[-61,+82]mm · 10 of 144 slices shown (1 of 2)]
[im 1/144]
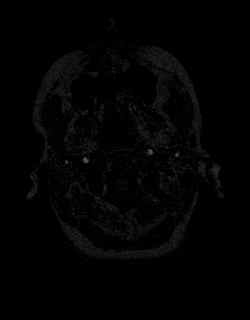
[im 16/144]
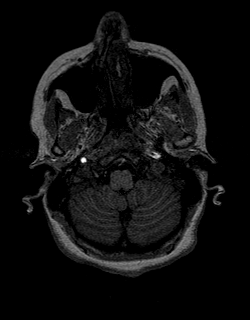
[im 32/144]
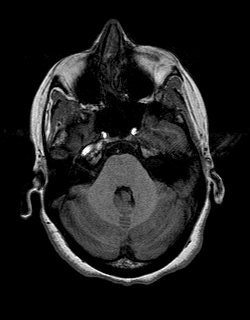
[im 48/144]
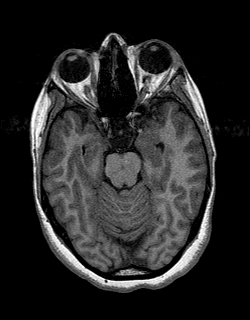
[im 64/144]
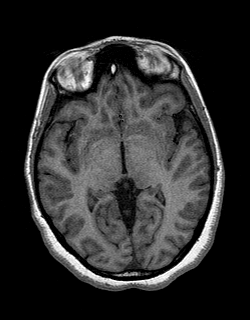
[im 80/144]
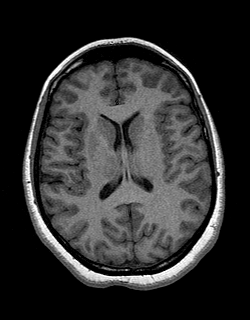
[im 96/144]
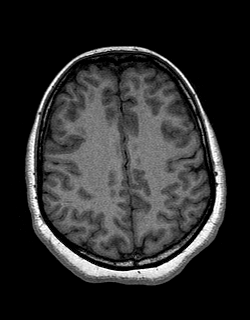
[im 112/144]
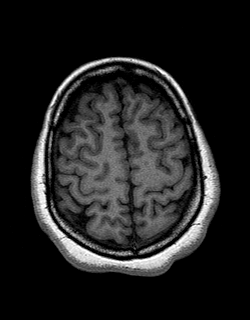
[im 128/144]
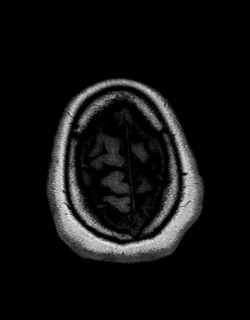
[im 144/144]
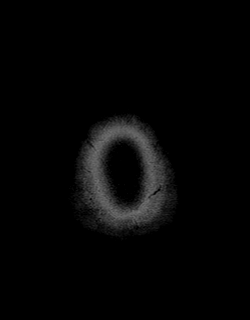

[Series 12: T2 · coronal · 5.0mm · 0.45mm/px · 2 of 27 slices shown (2 of 2)]
[im 1/27]
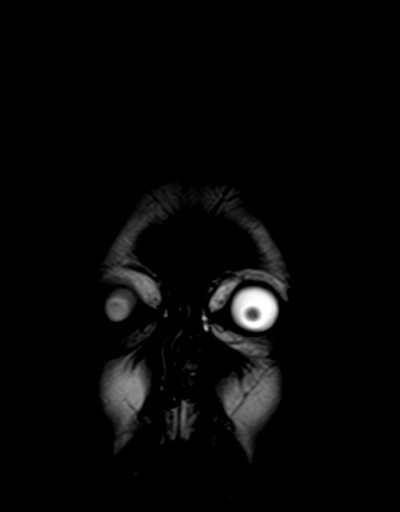
[im 27/27]
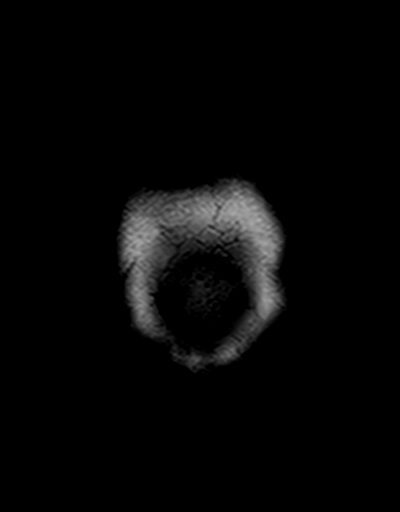

[Series 13: t1_mpr_tra · axial · 1.0mm · 0.75mm/px · z∈[-61,+82]mm · 10 of 144 slices shown (2 of 2)]
[im 1/144]
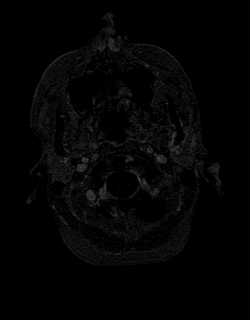
[im 16/144]
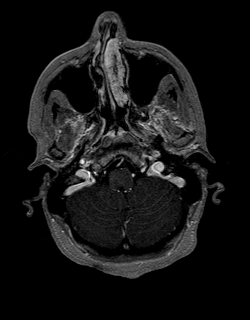
[im 32/144]
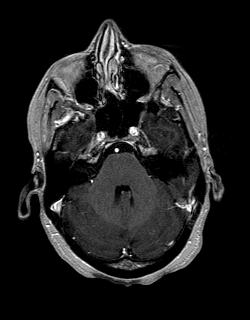
[im 48/144]
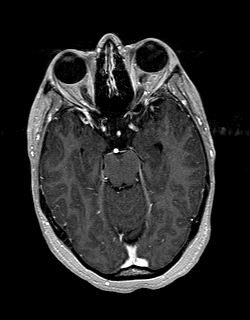
[im 64/144]
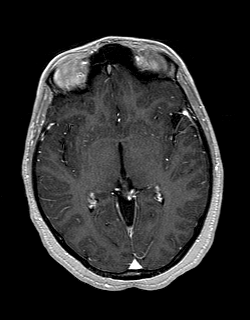
[im 80/144]
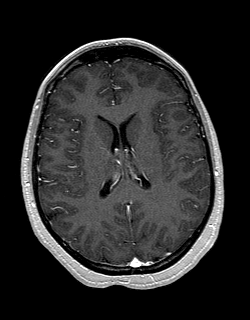
[im 96/144]
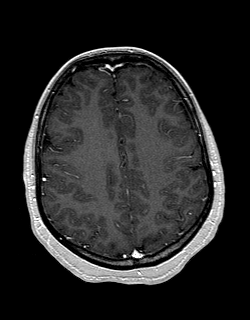
[im 112/144]
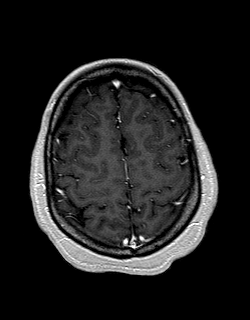
[im 128/144]
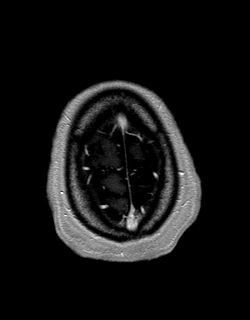
[im 144/144]
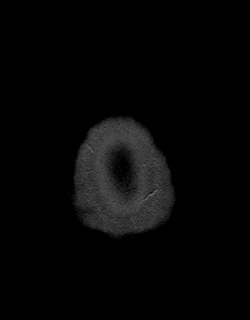

[Series 14: post cor · coronal · 5.0mm · 0.45mm/px · 2 of 27 slices shown]
[im 1/27]
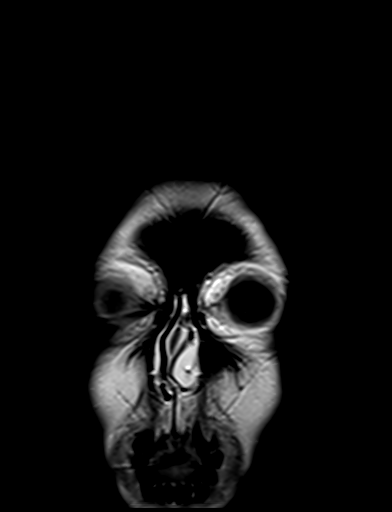
[im 27/27]
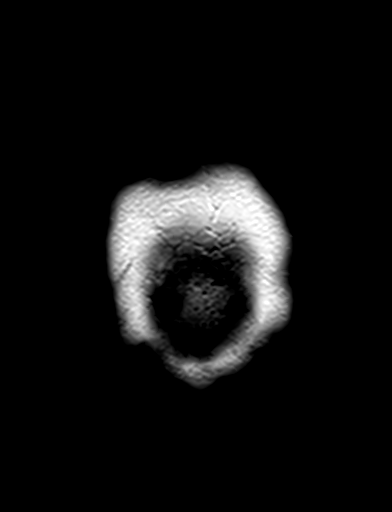

[48 of 48 positions shown; findings below may reference images not displayed]

FINDINGS: Brain: No infarction, hemorrhage, hydrocephalus, extra-axial
collection or mass lesion. Normal brain volume and white matter
appearance. Normal appearance of the pituitary and suprasellar
regions.

Vascular: Normal flow voids and vascular enhancements. Persistent
trigeminal artery on the left, incidental

Skull and upper cervical spine: Normal marrow signal

Sinuses/Orbits: Negative
IMPRESSION: 1. Normal MRI of the brain.
2. Incidental persistent trigeminal artery.

## 2023-02-20 DIAGNOSIS — Z419 Encounter for procedure for purposes other than remedying health state, unspecified: Secondary | ICD-10-CM | POA: Diagnosis not present

## 2023-03-12 DIAGNOSIS — Z419 Encounter for procedure for purposes other than remedying health state, unspecified: Secondary | ICD-10-CM | POA: Diagnosis not present

## 2023-04-09 DIAGNOSIS — Z419 Encounter for procedure for purposes other than remedying health state, unspecified: Secondary | ICD-10-CM | POA: Diagnosis not present

## 2023-04-18 DIAGNOSIS — D649 Anemia, unspecified: Secondary | ICD-10-CM | POA: Diagnosis not present

## 2023-04-18 DIAGNOSIS — D72819 Decreased white blood cell count, unspecified: Secondary | ICD-10-CM | POA: Diagnosis not present

## 2023-04-18 DIAGNOSIS — D563 Thalassemia minor: Secondary | ICD-10-CM | POA: Diagnosis not present

## 2023-05-11 DIAGNOSIS — N750 Cyst of Bartholin's gland: Secondary | ICD-10-CM | POA: Diagnosis not present

## 2023-05-21 DIAGNOSIS — Z419 Encounter for procedure for purposes other than remedying health state, unspecified: Secondary | ICD-10-CM | POA: Diagnosis not present

## 2023-06-15 ENCOUNTER — Ambulatory Visit: Payer: Managed Care, Other (non HMO) | Admitting: Family Medicine

## 2023-06-20 DIAGNOSIS — Z419 Encounter for procedure for purposes other than remedying health state, unspecified: Secondary | ICD-10-CM | POA: Diagnosis not present

## 2023-07-21 DIAGNOSIS — Z419 Encounter for procedure for purposes other than remedying health state, unspecified: Secondary | ICD-10-CM | POA: Diagnosis not present

## 2023-07-26 DIAGNOSIS — S29011A Strain of muscle and tendon of front wall of thorax, initial encounter: Secondary | ICD-10-CM | POA: Diagnosis not present

## 2023-07-26 DIAGNOSIS — Z111 Encounter for screening for respiratory tuberculosis: Secondary | ICD-10-CM | POA: Diagnosis not present

## 2023-07-26 DIAGNOSIS — R7989 Other specified abnormal findings of blood chemistry: Secondary | ICD-10-CM | POA: Diagnosis not present

## 2023-07-26 DIAGNOSIS — Z020 Encounter for examination for admission to educational institution: Secondary | ICD-10-CM | POA: Diagnosis not present

## 2023-07-28 DIAGNOSIS — Z0184 Encounter for antibody response examination: Secondary | ICD-10-CM | POA: Diagnosis not present

## 2023-07-28 DIAGNOSIS — Z789 Other specified health status: Secondary | ICD-10-CM | POA: Diagnosis not present

## 2023-08-02 DIAGNOSIS — Z23 Encounter for immunization: Secondary | ICD-10-CM | POA: Diagnosis not present

## 2023-08-17 DIAGNOSIS — M79672 Pain in left foot: Secondary | ICD-10-CM | POA: Diagnosis not present

## 2023-08-17 DIAGNOSIS — M79671 Pain in right foot: Secondary | ICD-10-CM | POA: Diagnosis not present

## 2023-08-20 DIAGNOSIS — Z419 Encounter for procedure for purposes other than remedying health state, unspecified: Secondary | ICD-10-CM | POA: Diagnosis not present

## 2023-08-26 DIAGNOSIS — L309 Dermatitis, unspecified: Secondary | ICD-10-CM | POA: Diagnosis not present

## 2023-09-02 DIAGNOSIS — Z23 Encounter for immunization: Secondary | ICD-10-CM | POA: Diagnosis not present

## 2023-09-14 ENCOUNTER — Ambulatory Visit: Admitting: Family Medicine

## 2023-09-20 DIAGNOSIS — Z419 Encounter for procedure for purposes other than remedying health state, unspecified: Secondary | ICD-10-CM | POA: Diagnosis not present

## 2023-10-03 DIAGNOSIS — Z0184 Encounter for antibody response examination: Secondary | ICD-10-CM | POA: Diagnosis not present

## 2023-10-21 DIAGNOSIS — Z419 Encounter for procedure for purposes other than remedying health state, unspecified: Secondary | ICD-10-CM | POA: Diagnosis not present

## 2023-11-09 ENCOUNTER — Other Ambulatory Visit (HOSPITAL_COMMUNITY): Payer: Self-pay

## 2023-11-09 MED ORDER — FLUZONE 0.5 ML IM SUSY
0.5000 mL | PREFILLED_SYRINGE | Freq: Once | INTRAMUSCULAR | 0 refills | Status: AC
  Filled 2023-11-09: qty 0.5, 1d supply, fill #0

## 2023-11-20 DIAGNOSIS — Z419 Encounter for procedure for purposes other than remedying health state, unspecified: Secondary | ICD-10-CM | POA: Diagnosis not present

## 2023-11-21 ENCOUNTER — Other Ambulatory Visit (HOSPITAL_COMMUNITY): Payer: Self-pay

## 2023-11-24 DIAGNOSIS — N750 Cyst of Bartholin's gland: Secondary | ICD-10-CM | POA: Diagnosis not present

## 2024-01-23 DIAGNOSIS — E242 Drug-induced Cushing's syndrome: Secondary | ICD-10-CM | POA: Diagnosis not present

## 2024-01-26 DIAGNOSIS — E242 Drug-induced Cushing's syndrome: Secondary | ICD-10-CM | POA: Diagnosis not present
